# Patient Record
Sex: Male | Born: 1985 | Hispanic: No | Marital: Married | Smoking: Former smoker
Health system: Southern US, Community
[De-identification: ages and names within clinical notes are randomized; demographics above are authoritative.]

## PROBLEM LIST (undated history)

## (undated) DIAGNOSIS — M5430 Sciatica, unspecified side: Secondary | ICD-10-CM

## (undated) DIAGNOSIS — Z9889 Other specified postprocedural states: Secondary | ICD-10-CM

## (undated) DIAGNOSIS — K5780 Diverticulitis of intestine, part unspecified, with perforation and abscess without bleeding: Secondary | ICD-10-CM

## (undated) HISTORY — PX: COLECTOMY WITH COLOSTOMY CREATION/HARTMANN PROCEDURE: SHX6598

## (undated) HISTORY — PX: COLONOSCOPY: SHX174

---

## 2018-04-02 ENCOUNTER — Other Ambulatory Visit: Payer: Self-pay | Admitting: Gastroenterology

## 2018-04-02 DIAGNOSIS — R1032 Left lower quadrant pain: Secondary | ICD-10-CM

## 2018-04-02 DIAGNOSIS — K219 Gastro-esophageal reflux disease without esophagitis: Secondary | ICD-10-CM

## 2020-11-04 ENCOUNTER — Encounter
Admission: EM | Disposition: A | Discharge status: Home / Self Care | Payer: Self-pay | Source: Ambulatory Visit | Attending: General Surgery

## 2020-11-04 ENCOUNTER — Inpatient Hospital Stay: Payer: BC Managed Care – PPO | Admitting: Certified Registered"

## 2020-11-04 ENCOUNTER — Inpatient Hospital Stay
Admission: EM | Admit: 2020-11-04 | Discharge: 2020-11-13 | DRG: 329 | Disposition: A | Discharge status: Home / Self Care | Payer: BC Managed Care – PPO | Source: Ambulatory Visit | Attending: General Surgery | Admitting: General Surgery

## 2020-11-04 ENCOUNTER — Other Ambulatory Visit: Payer: Self-pay

## 2020-11-04 ENCOUNTER — Emergency Department: Payer: BC Managed Care – PPO

## 2020-11-04 DIAGNOSIS — K651 Peritoneal abscess: Secondary | ICD-10-CM | POA: Diagnosis present

## 2020-11-04 DIAGNOSIS — R188 Other ascites: Secondary | ICD-10-CM | POA: Diagnosis not present

## 2020-11-04 DIAGNOSIS — Z20822 Contact with and (suspected) exposure to covid-19: Secondary | ICD-10-CM | POA: Diagnosis present

## 2020-11-04 DIAGNOSIS — R1084 Generalized abdominal pain: Secondary | ICD-10-CM

## 2020-11-04 DIAGNOSIS — K5720 Diverticulitis of large intestine with perforation and abscess without bleeding: Principal | ICD-10-CM | POA: Diagnosis present

## 2020-11-04 DIAGNOSIS — K567 Ileus, unspecified: Secondary | ICD-10-CM | POA: Diagnosis not present

## 2020-11-04 LAB — RESP PANEL BY RT-PCR (FLU A&B, COVID) ARPGX2
Influenza A by PCR: NEGATIVE
Influenza B by PCR: NEGATIVE
SARS Coronavirus 2 by RT PCR: NEGATIVE

## 2020-11-04 LAB — COMPREHENSIVE METABOLIC PANEL
ALT: 33 U/L (ref 0–44)
AST: 24 U/L (ref 15–41)
Albumin: 3.9 g/dL (ref 3.5–5.0)
Alkaline Phosphatase: 57 U/L (ref 38–126)
Anion gap: 12 (ref 5–15)
BUN: 13 mg/dL (ref 6–20)
CO2: 25 mmol/L (ref 22–32)
Calcium: 9.2 mg/dL (ref 8.9–10.3)
Chloride: 99 mmol/L (ref 98–111)
Creatinine, Ser: 0.91 mg/dL (ref 0.61–1.24)
GFR, Estimated: >60 mL/min (ref >60)
Glucose, Bld: 118 mg/dL — ABNORMAL HIGH (ref 70–99)
Potassium: 3.9 mmol/L (ref 3.5–5.1)
Sodium: 136 mmol/L (ref 135–145)
Total Bilirubin: 1.4 mg/dL — ABNORMAL HIGH (ref 0.3–1.2)
Total Protein: 8.6 g/dL — ABNORMAL HIGH (ref 6.5–8.1)

## 2020-11-04 LAB — CBC
HCT: 42.6 % (ref 39.0–52.0)
Hemoglobin: 14.6 g/dL (ref 13.0–17.0)
MCH: 31.1 pg (ref 26.0–34.0)
MCHC: 34.3 g/dL (ref 30.0–36.0)
MCV: 90.8 fL (ref 80.0–100.0)
Platelets: 198 10*3/uL (ref 150–400)
RBC: 4.69 MIL/uL (ref 4.22–5.81)
RDW: 11.8 % (ref 11.5–15.5)
WBC: 22.1 10*3/uL — ABNORMAL HIGH (ref 4.0–10.5)
nRBC: 0 % (ref 0.0–0.2)

## 2020-11-04 LAB — LIPASE, BLOOD: Lipase: 22 U/L (ref 11–51)

## 2020-11-04 SURGERY — COLECTOMY, SIGMOID, ROBOT-ASSISTED
Anesthesia: General | Site: Abdomen

## 2020-11-04 MED ORDER — SUCCINYLCHOLINE CHLORIDE 20 MG/ML IJ SOLN
INTRAMUSCULAR | Status: DC | PRN
  Administered 2020-11-04: 140 mg via INTRAVENOUS

## 2020-11-04 MED ORDER — MUPIROCIN 2 % EX OINT
1.0000 "application " | TOPICAL_OINTMENT | Freq: Two times a day (BID) | CUTANEOUS | Status: AC
  Administered 2020-11-06 – 2020-11-09 (×9): 1 via NASAL
  Filled 2020-11-04 (×2): qty 22

## 2020-11-04 MED ORDER — MORPHINE SULFATE (PF) 4 MG/ML IV SOLN
4.0000 mg | Freq: Once | INTRAVENOUS | Status: AC
  Administered 2020-11-04: 4 mg via INTRAVENOUS
  Filled 2020-11-04: qty 1

## 2020-11-04 MED ORDER — GLYCOPYRROLATE 0.2 MG/ML IJ SOLN
INTRAMUSCULAR | Status: DC | PRN
  Administered 2020-11-04: .2 mg via INTRAVENOUS

## 2020-11-04 MED ORDER — LIDOCAINE HCL (PF) 2 % IJ SOLN
INTRAMUSCULAR | Status: AC
  Filled 2020-11-04: qty 5

## 2020-11-04 MED ORDER — PIPERACILLIN-TAZOBACTAM 3.375 G IVPB 30 MIN
3.3750 g | Freq: Once | INTRAVENOUS | Status: AC
  Administered 2020-11-04: 3.375 g via INTRAVENOUS
  Filled 2020-11-04: qty 50

## 2020-11-04 MED ORDER — ONDANSETRON HCL 4 MG/2ML IJ SOLN
INTRAMUSCULAR | Status: DC | PRN
  Administered 2020-11-04: 4 mg via INTRAVENOUS

## 2020-11-04 MED ORDER — ROCURONIUM BROMIDE 10 MG/ML (PF) SYRINGE
PREFILLED_SYRINGE | INTRAVENOUS | Status: AC
  Filled 2020-11-04: qty 10

## 2020-11-04 MED ORDER — DEXAMETHASONE SODIUM PHOSPHATE 10 MG/ML IJ SOLN
INTRAMUSCULAR | Status: DC | PRN
  Administered 2020-11-04: 10 mg via INTRAVENOUS

## 2020-11-04 MED ORDER — ONDANSETRON 4 MG PO TBDP: 4.0000 mg | ORAL_TABLET | Freq: Four times a day (QID) | ORAL | Status: DC | PRN

## 2020-11-04 MED ORDER — IOHEXOL 300 MG/ML  SOLN
125.0000 mL | Freq: Once | INTRAMUSCULAR | Status: AC | PRN
  Administered 2020-11-04: 125 mL via INTRAVENOUS

## 2020-11-04 MED ORDER — MORPHINE SULFATE (PF) 4 MG/ML IV SOLN
4.0000 mg | INTRAVENOUS | Status: DC | PRN
  Administered 2020-11-05 – 2020-11-13 (×31): 4 mg via INTRAVENOUS
  Filled 2020-11-04 (×34): qty 1

## 2020-11-04 MED ORDER — BUPIVACAINE LIPOSOME 1.3 % IJ SUSP
INTRAMUSCULAR | Status: AC
  Filled 2020-11-04: qty 20

## 2020-11-04 MED ORDER — PIPERACILLIN-TAZOBACTAM 3.375 G IVPB
3.3750 g | Freq: Three times a day (TID) | INTRAVENOUS | Status: DC
  Administered 2020-11-05 – 2020-11-13 (×25): 3.375 g via INTRAVENOUS
  Filled 2020-11-04 (×25): qty 50

## 2020-11-04 MED ORDER — ROCURONIUM BROMIDE 100 MG/10ML IV SOLN
INTRAVENOUS | Status: DC | PRN
  Administered 2020-11-04 (×3): 20 mg via INTRAVENOUS
  Administered 2020-11-04: 50 mg via INTRAVENOUS
  Administered 2020-11-04 – 2020-11-05 (×3): 30 mg via INTRAVENOUS

## 2020-11-04 MED ORDER — ONDANSETRON HCL 4 MG/2ML IJ SOLN
INTRAMUSCULAR | Status: AC
  Filled 2020-11-04: qty 2

## 2020-11-04 MED ORDER — LACTATED RINGERS IV SOLN: INTRAVENOUS | Status: DC | PRN

## 2020-11-04 MED ORDER — ENOXAPARIN SODIUM 40 MG/0.4ML ~~LOC~~ SOLN: 40.0000 mg | SUBCUTANEOUS | Status: DC

## 2020-11-04 MED ORDER — FENTANYL CITRATE (PF) 100 MCG/2ML IJ SOLN
INTRAMUSCULAR | Status: DC | PRN
  Administered 2020-11-04: 50 ug via INTRAVENOUS
  Administered 2020-11-04: 100 ug via INTRAVENOUS
  Administered 2020-11-04 (×2): 50 ug via INTRAVENOUS

## 2020-11-04 MED ORDER — ONDANSETRON HCL 4 MG/2ML IJ SOLN
4.0000 mg | Freq: Four times a day (QID) | INTRAMUSCULAR | Status: DC | PRN
  Administered 2020-11-06 – 2020-11-07 (×2): 4 mg via INTRAVENOUS
  Filled 2020-11-04 (×2): qty 2

## 2020-11-04 MED ORDER — PHENYLEPHRINE HCL-NACL 10-0.9 MG/250ML-% IV SOLN
INTRAVENOUS | Status: DC | PRN
  Administered 2020-11-04: 20 ug/min via INTRAVENOUS

## 2020-11-04 MED ORDER — LIDOCAINE HCL (CARDIAC) PF 100 MG/5ML IV SOSY
PREFILLED_SYRINGE | INTRAVENOUS | Status: DC | PRN
  Administered 2020-11-04: 50 mg via INTRAVENOUS

## 2020-11-04 MED ORDER — DEXMEDETOMIDINE (PRECEDEX) IN NS 20 MCG/5ML (4 MCG/ML) IV SYRINGE
PREFILLED_SYRINGE | INTRAVENOUS | Status: AC
  Filled 2020-11-04: qty 5

## 2020-11-04 MED ORDER — DEXAMETHASONE SODIUM PHOSPHATE 10 MG/ML IJ SOLN
INTRAMUSCULAR | Status: AC
  Filled 2020-11-04: qty 1

## 2020-11-04 MED ORDER — ENOXAPARIN SODIUM 60 MG/0.6ML ~~LOC~~ SOLN
0.5000 mg/kg | SUBCUTANEOUS | Status: DC
  Administered 2020-11-05 – 2020-11-12 (×7): 60 mg via SUBCUTANEOUS
  Filled 2020-11-04 (×9): qty 0.6

## 2020-11-04 MED ORDER — FENTANYL CITRATE (PF) 250 MCG/5ML IJ SOLN
INTRAMUSCULAR | Status: AC
  Filled 2020-11-04: qty 5

## 2020-11-04 MED ORDER — EPHEDRINE SULFATE 50 MG/ML IJ SOLN
INTRAMUSCULAR | Status: DC | PRN
  Administered 2020-11-04: 10 mg via INTRAVENOUS
  Administered 2020-11-05: 20 mg via INTRAVENOUS
  Administered 2020-11-05: 10 mg via INTRAVENOUS

## 2020-11-04 MED ORDER — DEXMEDETOMIDINE (PRECEDEX) IN NS 20 MCG/5ML (4 MCG/ML) IV SYRINGE
PREFILLED_SYRINGE | INTRAVENOUS | Status: DC | PRN
  Administered 2020-11-04: 8 ug via INTRAVENOUS
  Administered 2020-11-04: 12 ug via INTRAVENOUS

## 2020-11-04 MED ORDER — MORPHINE SULFATE (PF) 4 MG/ML IV SOLN
4.0000 mg | Freq: Once | INTRAVENOUS | Status: AC
Start: 2020-11-04 — End: 2020-11-04
  Administered 2020-11-04: 4 mg via INTRAVENOUS
  Filled 2020-11-04: qty 1

## 2020-11-04 MED ORDER — PHENYLEPHRINE HCL (PRESSORS) 10 MG/ML IV SOLN
INTRAVENOUS | Status: DC | PRN
  Administered 2020-11-04 (×3): 100 ug via INTRAVENOUS
  Administered 2020-11-04: 200 ug via INTRAVENOUS
  Administered 2020-11-04: 100 ug via INTRAVENOUS

## 2020-11-04 MED ORDER — ACETAMINOPHEN 10 MG/ML IV SOLN
INTRAVENOUS | Status: AC
  Filled 2020-11-04: qty 100

## 2020-11-04 MED ORDER — SODIUM CHLORIDE 0.9 % IV BOLUS
1000.0000 mL | Freq: Once | INTRAVENOUS | Status: AC
  Administered 2020-11-04: 1000 mL via INTRAVENOUS

## 2020-11-04 MED ORDER — HYDROCODONE-ACETAMINOPHEN 5-325 MG PO TABS
1.0000 | ORAL_TABLET | ORAL | Status: DC | PRN
  Administered 2020-11-05 – 2020-11-06 (×7): 2 via ORAL
  Administered 2020-11-07: 1 via ORAL
  Administered 2020-11-07: 2 via ORAL
  Filled 2020-11-04 (×9): qty 2

## 2020-11-04 MED ORDER — ACETAMINOPHEN 650 MG RE SUPP: 650.0000 mg | Freq: Four times a day (QID) | RECTAL | Status: DC | PRN

## 2020-11-04 MED ORDER — PANTOPRAZOLE SODIUM 40 MG IV SOLR
40.0000 mg | Freq: Every day | INTRAVENOUS | Status: DC
  Administered 2020-11-05 – 2020-11-12 (×8): 40 mg via INTRAVENOUS
  Filled 2020-11-04 (×8): qty 40

## 2020-11-04 MED ORDER — PROPOFOL 10 MG/ML IV BOLUS
INTRAVENOUS | Status: DC | PRN
  Administered 2020-11-04: 200 mg via INTRAVENOUS

## 2020-11-04 MED ORDER — ONDANSETRON HCL 4 MG/2ML IJ SOLN
4.0000 mg | Freq: Once | INTRAMUSCULAR | Status: AC
  Administered 2020-11-04: 4 mg via INTRAVENOUS
  Filled 2020-11-04: qty 2

## 2020-11-04 MED ORDER — PROPOFOL 10 MG/ML IV BOLUS
INTRAVENOUS | Status: AC
  Filled 2020-11-04: qty 40

## 2020-11-04 MED ORDER — BUPIVACAINE HCL (PF) 0.5 % IJ SOLN
INTRAMUSCULAR | Status: AC
  Filled 2020-11-04: qty 30

## 2020-11-04 MED ORDER — ACETAMINOPHEN 325 MG PO TABS
650.0000 mg | ORAL_TABLET | Freq: Four times a day (QID) | ORAL | Status: DC | PRN
  Administered 2020-11-12 – 2020-11-13 (×5): 650 mg via ORAL
  Filled 2020-11-04 (×5): qty 2

## 2020-11-04 MED ORDER — SUCCINYLCHOLINE CHLORIDE 200 MG/10ML IV SOSY
PREFILLED_SYRINGE | INTRAVENOUS | Status: AC
  Filled 2020-11-04: qty 10

## 2020-11-04 SURGICAL SUPPLY — 102 items
BLADE CLIPPER SURG (BLADE) ×2 IMPLANT
BLADE SURG SZ10 CARB STEEL (BLADE) ×2 IMPLANT
BLADE SURG SZ11 CARB STEEL (BLADE) ×2 IMPLANT
BULB RESERV EVAC DRAIN JP 100C (MISCELLANEOUS) ×2 IMPLANT
CANNULA REDUC XI 12-8 STAPL (CANNULA) ×1
CANNULA REDUCER 12-8 DVNC XI (CANNULA) ×1 IMPLANT
CHLORAPREP W/TINT 26 (MISCELLANEOUS) ×2 IMPLANT
CLIP VESOLOCK MED LG 6/CT (CLIP) IMPLANT
COVER TIP SHEARS 8 DVNC (MISCELLANEOUS) ×1 IMPLANT
COVER TIP SHEARS 8MM DA VINCI (MISCELLANEOUS) ×1
COVER WAND RF STERILE (DRAPES) IMPLANT
DEFOGGER SCOPE WARMER CLEARIFY (MISCELLANEOUS) ×2 IMPLANT
DERMABOND ADVANCED (GAUZE/BANDAGES/DRESSINGS)
DERMABOND ADVANCED .7 DNX12 (GAUZE/BANDAGES/DRESSINGS) IMPLANT
DRAIN CHANNEL JP 19F (MISCELLANEOUS) ×2 IMPLANT
DRAPE 3/4 80X56 (DRAPES) ×4 IMPLANT
DRAPE ARM DVNC X/XI (DISPOSABLE) ×4 IMPLANT
DRAPE COLUMN DVNC XI (DISPOSABLE) ×1 IMPLANT
DRAPE DA VINCI XI ARM (DISPOSABLE) ×4
DRAPE DA VINCI XI COLUMN (DISPOSABLE) ×1
DRSG OPSITE POSTOP 4X10 (GAUZE/BANDAGES/DRESSINGS) IMPLANT
DRSG OPSITE POSTOP 4X6 (GAUZE/BANDAGES/DRESSINGS) ×2 IMPLANT
DRSG OPSITE POSTOP 4X8 (GAUZE/BANDAGES/DRESSINGS) IMPLANT
DRSG TEGADERM 2-3/8X2-3/4 SM (GAUZE/BANDAGES/DRESSINGS) ×2 IMPLANT
ELECT BLADE 6.5 EXT (BLADE) IMPLANT
ELECT CAUTERY BLADE 6.4 (BLADE) ×2 IMPLANT
ELECT REM PT RETURN 9FT ADLT (ELECTROSURGICAL) ×2
ELECTRODE REM PT RTRN 9FT ADLT (ELECTROSURGICAL) ×1 IMPLANT
GLOVE SURG ENC MOIS LTX SZ6.5 (GLOVE) ×6 IMPLANT
GLOVE SURG UNDER POLY LF SZ6.5 (GLOVE) ×6 IMPLANT
GOWN STRL REUS W/ TWL LRG LVL3 (GOWN DISPOSABLE) ×6 IMPLANT
GOWN STRL REUS W/TWL LRG LVL3 (GOWN DISPOSABLE) ×6
GRASPER SUT TROCAR 14GX15 (MISCELLANEOUS) IMPLANT
HANDLE YANKAUER SUCT BULB TIP (MISCELLANEOUS) ×2 IMPLANT
IRRIGATION STRYKERFLOW (MISCELLANEOUS) ×1 IMPLANT
IRRIGATOR STRYKERFLOW (MISCELLANEOUS) ×2
IRRIGATOR SUCT 8 DISP DVNC XI (IRRIGATION / IRRIGATOR) ×1 IMPLANT
IRRIGATOR SUCTION 8MM XI DISP (IRRIGATION / IRRIGATOR) ×1
IV NS 1000ML (IV SOLUTION) ×1
IV NS 1000ML BAXH (IV SOLUTION) ×1 IMPLANT
KIT OSTOMY 2 PC DRNBL 2.25 STR (WOUND CARE) ×1 IMPLANT
KIT OSTOMY DRAINABLE 2.25 STR (WOUND CARE) ×1
KIT PINK PAD W/HEAD ARE REST (MISCELLANEOUS) ×2
KIT PINK PAD W/HEAD ARM REST (MISCELLANEOUS) ×1 IMPLANT
LABEL OR SOLS (LABEL) ×2 IMPLANT
MANIFOLD NEPTUNE II (INSTRUMENTS) ×2 IMPLANT
NEEDLE HYPO 22GX1.5 SAFETY (NEEDLE) ×2 IMPLANT
OBTURATOR OPTICAL STANDARD 8MM (TROCAR) ×1
OBTURATOR OPTICAL STND 8 DVNC (TROCAR) ×1
OBTURATOR OPTICALSTD 8 DVNC (TROCAR) ×1 IMPLANT
PACK COLON CLEAN CLOSURE (MISCELLANEOUS) IMPLANT
PACK LAP CHOLECYSTECTOMY (MISCELLANEOUS) ×2 IMPLANT
PENCIL ELECTRO HAND CTR (MISCELLANEOUS) ×2 IMPLANT
PORT ACCESS TROCAR AIRSEAL 5 (TROCAR) ×2 IMPLANT
RELOAD STAPLER 3.5X45 BLU DVNC (STAPLE) ×1 IMPLANT
RELOAD STAPLER 3.5X60 BLU DVNC (STAPLE) ×2 IMPLANT
RELOAD STAPLER 4.3X60 GRN DVNC (STAPLE) ×4 IMPLANT
RETRACTOR RING XSMALL (MISCELLANEOUS) IMPLANT
RTRCTR WOUND ALEXIS 13CM XS SH (MISCELLANEOUS)
SEAL CANN UNIV 5-8 DVNC XI (MISCELLANEOUS) ×3 IMPLANT
SEAL XI 5MM-8MM UNIVERSAL (MISCELLANEOUS) ×3
SEALER VESSEL DA VINCI XI (MISCELLANEOUS) ×2
SEALER VESSEL EXT DVNC XI (MISCELLANEOUS) ×2 IMPLANT
SET TRI-LUMEN FLTR TB AIRSEAL (TUBING) ×2 IMPLANT
SOLUTION ELECTROLUBE (MISCELLANEOUS) ×2 IMPLANT
SPONGE GAUZE 2X2 8PLY STRL LF (GAUZE/BANDAGES/DRESSINGS) ×4 IMPLANT
SPONGE LAP 4X18 RFD (DISPOSABLE) ×2 IMPLANT
STAPLER 45 DA VINCI SURE FORM (STAPLE)
STAPLER 45 SUREFORM DVNC (STAPLE) IMPLANT
STAPLER 60 DA VINCI SURE FORM (STAPLE) ×1
STAPLER 60 SUREFORM DVNC (STAPLE) ×1 IMPLANT
STAPLER CANNULA SEAL DVNC XI (STAPLE) ×1 IMPLANT
STAPLER CANNULA SEAL XI (STAPLE) ×1
STAPLER RELOAD 3.5X45 BLU DVNC (STAPLE) ×1
STAPLER RELOAD 3.5X45 BLUE (STAPLE) ×1
STAPLER RELOAD 3.5X60 BLU DVNC (STAPLE) ×2
STAPLER RELOAD 3.5X60 BLUE (STAPLE) ×2
STAPLER RELOAD 4.3X60 GREEN (STAPLE) ×4
STAPLER RELOAD 4.3X60 GRN DVNC (STAPLE) ×4
STAPLER SKIN PROX 35W (STAPLE) ×2 IMPLANT
SUT ETHILON 3-0 FS-10 30 BLK (SUTURE) ×2
SUT MNCRL 4-0 (SUTURE) ×2
SUT MNCRL 4-0 27XMFL (SUTURE) ×2
SUT PDS PLUS 0 (SUTURE) ×2
SUT PDS PLUS AB 0 CT-2 (SUTURE) ×2 IMPLANT
SUT SILK 0 SH 30 (SUTURE) ×4 IMPLANT
SUT SILK 3-0 (SUTURE) IMPLANT
SUT STRATAFIX PDS 30 CT-1 (SUTURE) ×2 IMPLANT
SUT VIC AB 2-0 SH 27 (SUTURE) ×1
SUT VIC AB 2-0 SH 27XBRD (SUTURE) ×1 IMPLANT
SUT VIC AB 3-0 SH 27 (SUTURE) ×4
SUT VIC AB 3-0 SH 27X BRD (SUTURE) ×4 IMPLANT
SUT VIC AB 3-0 SH 8-18 (SUTURE) ×4 IMPLANT
SUT VICRYL 0 AB UR-6 (SUTURE) ×6 IMPLANT
SUT VLOC 90 6 CV-15 VIOLET (SUTURE) IMPLANT
SUT VLOC 90 S/L VL9 GS22 (SUTURE) ×2 IMPLANT
SUTURE EHLN 3-0 FS-10 30 BLK (SUTURE) ×1 IMPLANT
SUTURE MNCRL 4-0 27XMF (SUTURE) ×2 IMPLANT
SYR 30ML LL (SYRINGE) ×4 IMPLANT
SYS TROCAR 1.5-3 SLV ABD GEL (ENDOMECHANICALS)
SYSTEM TROCR 1.5-3 SLV ABD GEL (ENDOMECHANICALS) IMPLANT
TRAY FOLEY MTR SLVR 16FR STAT (SET/KITS/TRAYS/PACK) ×2 IMPLANT

## 2020-11-04 NOTE — ED Triage Notes (Signed)
Pt comes with c/o upper abdominal pain for 3 days. Pt states he was advised  By his PCP to come here to be seen

## 2020-11-04 NOTE — ED Notes (Signed)
Pt c/o of abd pain returning. Vicente Males, MD notified. Morphine 4mg  ordered.

## 2020-11-04 NOTE — Anesthesia Procedure Notes (Signed)
Procedure Name: Intubation Performed by: Mckenze Slone, CRNA Pre-anesthesia Checklist: Patient identified, Patient being monitored, Timeout performed, Emergency Drugs available and Suction available Patient Re-evaluated:Patient Re-evaluated prior to induction Oxygen Delivery Method: Circle system utilized Preoxygenation: Pre-oxygenation with 100% oxygen Induction Type: IV induction and Rapid sequence Laryngoscope Size: McGraph and 4 Grade View: Grade I Tube type: Oral Tube size: 7.5 mm Number of attempts: 1 Airway Equipment and Method: Stylet and Video-laryngoscopy Placement Confirmation: ETT inserted through vocal cords under direct vision,  positive ETCO2 and breath sounds checked- equal and bilateral Secured at: 23 cm Tube secured with: Tape Dental Injury: Teeth and Oropharynx as per pre-operative assessment        

## 2020-11-04 NOTE — ED Notes (Signed)
Pt states that pain is at ease at the moment.

## 2020-11-04 NOTE — H&P (Signed)
SURGICAL HISTORY AND PHYSICAL NOTE   HISTORY OF PRESENT ILLNESS (HPI):  35 y.o. male presented to [facility] ED for evaluation of abdominal pain that started 3 days ago.  The patient reported the pain started to intensified today.  The pain is generalized.  There is no pain radiation.  Aggravating factor is applying pressure in the abdomen.  Alleviating factor is morphine.  He reported nausea but no vomiting.  In the ED he was found with leukocytosis.  CT scan of the abdomen and pelvis shows free air with generalized peritonitis.  I personally evaluated the images.  Surgery is consulted by Dr. Vicente Males in this context for evaluation and management of perforated diverticulitis.  PAST MEDICAL HISTORY (PMH):  History reviewed. No pertinent past medical history.   PAST SURGICAL HISTORY (PSH):  History reviewed. No pertinent surgical history.   MEDICATIONS:  Prior to Admission medications   Not on File     ALLERGIES:  No Known Allergies   SOCIAL HISTORY:  Social History   Socioeconomic History  . Marital status: Unknown    Spouse name: Not on file  . Number of children: Not on file  . Years of education: Not on file  . Highest education level: Not on file  Occupational History  . Not on file  Tobacco Use  . Smoking status: Never Smoker  . Smokeless tobacco: Never Used  Substance and Sexual Activity  . Alcohol use: Yes  . Drug use: Never  . Sexual activity: Not on file  Other Topics Concern  . Not on file  Social History Narrative  . Not on file   Social Determinants of Health   Financial Resource Strain: Not on file  Food Insecurity: Not on file  Transportation Needs: Not on file  Physical Activity: Not on file  Stress: Not on file  Social Connections: Not on file  Intimate Partner Violence: Not on file      FAMILY HISTORY:  No family history on file.   REVIEW OF SYSTEMS:  Constitutional: denies weight loss, fever, chills, or sweats  Eyes: denies any other vision  changes, history of eye injury  ENT: denies sore throat, hearing problems  Respiratory: denies shortness of breath, wheezing  Cardiovascular: denies chest pain, palpitations  Gastrointestinal: Positive abdominal pain, nausea Genitourinary: denies burning with urination or urinary frequency Musculoskeletal: denies any other joint pains or cramps  Skin: denies any other rashes or skin discolorations  Neurological: denies any other headache, dizziness, weakness  Psychiatric: denies any other depression, anxiety   All other review of systems were negative   VITAL SIGNS:  Temp:  [100.4 F (38 C)] 100.4 F (38 C) (02/10 1525) Pulse Rate:  [105-120] 109 (02/10 1830) Resp:  [17-26] 21 (02/10 1830) BP: (109-129)/(75-95) 117/75 (02/10 1830) SpO2:  [91 %-95 %] 93 % (02/10 1830) Weight:  [117.9 kg] 117.9 kg (02/10 1523)     Height: 6\' 4"  (193 cm) Weight: 117.9 kg BMI (Calculated): 31.66   INTAKE/OUTPUT:  This shift: No intake/output data recorded.  Last 2 shifts: @IOLAST2SHIFTS @   PHYSICAL EXAM:  Constitutional:  -- Normal body habitus  -- Awake, alert, and oriented x3  Eyes:  -- Pupils equally round and reactive to light  -- No scleral icterus  Ear, nose, and throat:  -- No jugular venous distension  Pulmonary:  -- No crackles  -- Equal breath sounds bilaterally -- Breathing non-labored at rest Cardiovascular:  -- S1, S2 present  -- No pericardial rubs Gastrointestinal:  -- Abdomen  soft, tender, non-distended, with guarding but no rebound tenderness -- No abdominal masses appreciated, pulsatile or otherwise  Musculoskeletal and Integumentary:  -- Wounds: None appreciated -- Extremities: B/L UE and LE FROM, hands and feet warm, no edema  Neurologic:  -- Motor function: intact and symmetric -- Sensation: intact and symmetric   Labs:  CBC Latest Ref Rng & Units 11/04/2020  WBC 4.0 - 10.5 K/uL 22.1(H)  Hemoglobin 13.0 - 17.0 g/dL 70.2  Hematocrit 63.7 - 52.0 % 42.6   Platelets 150 - 400 K/uL 198   CMP Latest Ref Rng & Units 11/04/2020  Glucose 70 - 99 mg/dL 858(I)  BUN 6 - 20 mg/dL 13  Creatinine 5.02 - 7.74 mg/dL 1.28  Sodium 786 - 767 mmol/L 136  Potassium 3.5 - 5.1 mmol/L 3.9  Chloride 98 - 111 mmol/L 99  CO2 22 - 32 mmol/L 25  Calcium 8.9 - 10.3 mg/dL 9.2  Total Protein 6.5 - 8.1 g/dL 2.0(N)  Total Bilirubin 0.3 - 1.2 mg/dL 4.7(S)  Alkaline Phos 38 - 126 U/L 57  AST 15 - 41 U/L 24  ALT 0 - 44 U/L 33    Imaging studies:  EXAM: CT ABDOMEN AND PELVIS WITH CONTRAST  TECHNIQUE: Multidetector CT imaging of the abdomen and pelvis was performed using the standard protocol following bolus administration of intravenous contrast.  CONTRAST:  OMNIPAQUE IOHEXOL 300 MG/ML  SOLN  COMPARISON:  None.  FINDINGS: Lower chest: The visualized lung bases are clear.  Scattered pneumoperitoneum and trace free fluid in the pelvis.  Hepatobiliary: Mild fatty liver. No intrahepatic biliary dilatation. The gallbladder is unremarkable.  Pancreas: Unremarkable. No pancreatic ductal dilatation or surrounding inflammatory changes.  Spleen: Normal in size without focal abnormality.  Adrenals/Urinary Tract: The adrenal glands unremarkable the kidneys, visualized ureters, and urinary bladder appear unremarkable.  Stomach/Bowel: There is sigmoid diverticulosis without active inflammatory changes. There is extraluminal air consistent with perforation. No drainable fluid collection or abscess. Inflammatory changes of distal small bowel, reactive to sigmoid diverticulitis. There is no bowel obstruction. The appendix is normal.  Vascular/Lymphatic: The abdominal aorta IVC unremarkable. No portal venous gas. There is no adenopathy.  Reproductive: The prostate and seminal vesicles are grossly unremarkable. No pelvic mass.  Other: None  Musculoskeletal: No acute or significant osseous findings.  IMPRESSION: 1. Perforated sigmoid  diverticulitis. No drainable fluid collection or abscess. 2. Reactive changes of distal small bowel. No bowel obstruction. Normal appendix. 3. Mild fatty liver.  These results were called by telephone at the time of interpretation on 11/04/2020 at 5:10 pm to provider [facility] , who verbally acknowledged these results.   Electronically Signed   By: Elgie Collard M.D.   On: 11/04/2020 17:12  Assessment/Plan:  35 y.o. male with perforated diverticulitis with generalized peritonitis.  Patient was oriented about the CT scan finding.  The physical exam and history is consistent with perforated diverticulitis.  I discussed with the patient that the best treatment for diverticulitis is resection of the diseased portion of the intestine with creation of end colostomy.  The patient is very concerned about his future regarding his job and he requested to try to proceed with minimally invasive procedure if possible.  I discussed with the patient the alternative of robotic assisted laparoscopic partial colectomy with end colostomy but that there is always a chance that the surgery cannot be completed laparoscopically and will need to be converted to open surgical management.  He report understood and agreed to proceed with surgical  management.  Patient started on antibiotic therapy.  Patient n.p.o. and with IV fluid hydration   Gae Gallop, MD

## 2020-11-04 NOTE — Anesthesia Preprocedure Evaluation (Addendum)
Anesthesia Evaluation  Patient identified by MRN, date of birth, ID band Patient awake    Reviewed: Allergy & Precautions, H&P , NPO status , Patient's Chart, lab work & pertinent test results  History of Anesthesia Complications Negative for: history of anesthetic complications  Airway Mallampati: I  TM Distance: >3 FB Neck ROM: full    Dental  (+) Chipped   Pulmonary neg shortness of breath, sleep apnea ,    Pulmonary exam normal        Cardiovascular (-) angina(-) Past MI negative cardio ROS Normal cardiovascular exam     Neuro/Psych negative neurological ROS  negative psych ROS   GI/Hepatic negative GI ROS, Neg liver ROS,   Endo/Other  negative endocrine ROS  Renal/GU      Musculoskeletal   Abdominal   Peds  Hematology negative hematology ROS (+)   Anesthesia Other Findings Septic w free air  History reviewed. No pertinent surgical history.  BMI    Body Mass Index: 31.65 kg/m      Reproductive/Obstetrics negative OB ROS                            Anesthesia Physical Anesthesia Plan  ASA: III and emergent  Anesthesia Plan: General ETT, Rapid Sequence and Cricoid Pressure   Post-op Pain Management:    Induction: Intravenous  PONV Risk Score and Plan: Ondansetron, Dexamethasone, Midazolam and Treatment may vary due to age or medical condition  Airway Management Planned: Oral ETT  Additional Equipment:   Intra-op Plan:   Post-operative Plan: Extubation in OR  Informed Consent: I have reviewed the patients History and Physical, chart, labs and discussed the procedure including the risks, benefits and alternatives for the proposed anesthesia with the patient or authorized representative who has indicated his/her understanding and acceptance.     Dental Advisory Given  Plan Discussed with: Anesthesiologist, CRNA and Surgeon  Anesthesia Plan Comments: (Patient  consented for risks of anesthesia including but not limited to:  - adverse reactions to medications - damage to eyes, teeth, lips or other oral mucosa - nerve damage due to positioning  - sore throat or hoarseness - Damage to heart, brain, nerves, lungs, other parts of body or loss of life  Patient voiced understanding.)        Anesthesia Quick Evaluation

## 2020-11-04 NOTE — ED Provider Notes (Signed)
[facility] Emergency Department Provider Note   ____________________________________________   Event Date/Time   First MD Initiated Contact with Patient 11/04/20 1603     (approximate)  I have reviewed the triage vital signs and the nursing notes.   HISTORY  Chief Complaint Abdominal Pain    HPI Mark Spencer is a 35 y.o. male with no stated past medical history who presents for abdominal pain over the last 3 days.  Patient describes suprapubic abdominal pressure that now radiates into all abdominal quadrants.  Patient describes initially with pain at approximately 3/10 that has worsened to 10/10 on occasion and waxes and wanes.  Patient denies any exacerbating or relieving factors.  Patient does endorse associated nonbloody diarrhea.  Patient currently denies any vision changes, tinnitus, difficulty speaking, facial droop, sore throat, chest pain, shortness of breath, nausea/vomiting, dysuria, or weakness/numbness/paresthesias in any extremity         History reviewed. No pertinent past medical history.  Patient Active Problem List   Diagnosis Date Noted  . Diverticulitis of colon with perforation 11/04/2020    History reviewed. No pertinent surgical history.  Prior to Admission medications   Not on File    Allergies Patient has no known allergies.  History reviewed. No pertinent family history.  Social History Social History   Tobacco Use  . Smoking status: Never Smoker  . Smokeless tobacco: Never Used  Substance Use Topics  . Alcohol use: Yes  . Drug use: Never    Review of Systems Constitutional: No fever/chills Eyes: No visual changes. ENT: No sore throat. Cardiovascular: Denies chest pain. Respiratory: Denies shortness of breath. Gastrointestinal: Endorses abdominal pain nausea, and diarrhea.  Patient denies vomiting Genitourinary: Negative for dysuria. Musculoskeletal: Negative for acute arthralgias Skin: Negative for  rash. Neurological: Negative for headaches, weakness/numbness/paresthesias in any extremity Psychiatric: Negative for suicidal ideation/homicidal ideation   ____________________________________________   PHYSICAL EXAM:  VITAL SIGNS: ED Triage Vitals  Enc Vitals Group     BP 11/04/20 1525 (!) 129/95     Pulse Rate 11/04/20 1525 (!) 120     Resp 11/04/20 1525 20     Temp 11/04/20 1525 (!) 100.4 F (38 C)     Temp Source 11/04/20 1525 Oral     SpO2 11/04/20 1525 94 %     Weight 11/04/20 1523 260 lb (117.9 kg)     Height 11/04/20 1523 6\' 4"  (1.93 m)     Head Circumference --      Peak Flow --      Pain Score 11/04/20 1518 7     Pain Loc --      Pain Edu? --      Excl. in GC? --    Constitutional: Alert and oriented. Well appearing and in no acute distress. Eyes: Conjunctivae are normal. PERRL. Head: Atraumatic. Nose: No congestion/rhinnorhea. Mouth/Throat: Mucous membranes are moist. Neck: No stridor Cardiovascular: Grossly normal heart sounds.  Good peripheral circulation. Respiratory: Normal respiratory effort.  No retractions. Gastrointestinal: Soft and generalized tenderness palpation in all quadrants. No distention. Musculoskeletal: No obvious deformities Neurologic:  Normal speech and language. No gross focal neurologic deficits are appreciated. Skin:  Skin is warm and dry. No rash noted. Psychiatric: Mood and affect are normal. Speech and behavior are normal.  ____________________________________________   LABS (all labs ordered are listed, but only abnormal results are displayed)  Labs Reviewed  COMPREHENSIVE METABOLIC PANEL - Abnormal; Notable for the following components:      Result Value  Glucose, Bld 118 (*)    Total Protein 8.6 (*)    Total Bilirubin 1.4 (*)    All other components within normal limits  CBC - Abnormal; Notable for the following components:   WBC 22.1 (*)    All other components within normal limits  RESP PANEL BY RT-PCR (FLU A&B,  COVID) ARPGX2  CULTURE, BLOOD (SINGLE)  SURGICAL PCR SCREEN  LIPASE, BLOOD  URINALYSIS, COMPLETE (UACMP) WITH MICROSCOPIC  HIV ANTIBODY (ROUTINE TESTING W REFLEX)  BASIC METABOLIC PANEL  CBC    RADIOLOGY  ED MD interpretation: CT of the abdomen and pelvis with IV contrast shows his perforated sigmoid diverticulitis without drainable fluid collection or abscess  Official radiology report(s): CT Abdomen Pelvis W Contrast  Result Date: 11/04/2020 CLINICAL DATA:  35 year old male with right lower quadrant abdominal pain. EXAM: CT ABDOMEN AND PELVIS WITH CONTRAST TECHNIQUE: Multidetector CT imaging of the abdomen and pelvis was performed using the standard protocol following bolus administration of intravenous contrast. CONTRAST:  OMNIPAQUE IOHEXOL 300 MG/ML  SOLN COMPARISON:  None. FINDINGS: Lower chest: The visualized lung bases are clear. Scattered pneumoperitoneum and trace free fluid in the pelvis. Hepatobiliary: Mild fatty liver. No intrahepatic biliary dilatation. The gallbladder is unremarkable. Pancreas: Unremarkable. No pancreatic ductal dilatation or surrounding inflammatory changes. Spleen: Normal in size without focal abnormality. Adrenals/Urinary Tract: The adrenal glands unremarkable the kidneys, visualized ureters, and urinary bladder appear unremarkable. Stomach/Bowel: There is sigmoid diverticulosis without active inflammatory changes. There is extraluminal air consistent with perforation. No drainable fluid collection or abscess. Inflammatory changes of distal small bowel, reactive to sigmoid diverticulitis. There is no bowel obstruction. The appendix is normal. Vascular/Lymphatic: The abdominal aorta IVC unremarkable. No portal venous gas. There is no adenopathy. Reproductive: The prostate and seminal vesicles are grossly unremarkable. No pelvic mass. Other: None Musculoskeletal: No acute or significant osseous findings. IMPRESSION: 1. Perforated sigmoid diverticulitis. No  drainable fluid collection or abscess. 2. Reactive changes of distal small bowel. No bowel obstruction. Normal appendix. 3. Mild fatty liver. These results were called by telephone at the time of interpretation on 11/04/2020 at 5:10 pm to provider [facility] , who verbally acknowledged these results. Electronically Signed   By: Elgie Collard M.D.   On: 11/04/2020 17:12    ____________________________________________   PROCEDURES  Procedure(s) performed (including Critical Care):  .Critical Care Performed by: Merwyn Katos, MD Authorized by: Merwyn Katos, MD   Critical care provider statement:    Critical care time (minutes):  35   Critical care time was exclusive of:  Separately billable procedures and treating other patients   Critical care was necessary to treat or prevent imminent or life-threatening deterioration of the following conditions:  Sepsis   Critical care was time spent personally by me on the following activities:  Discussions with consultants, evaluation of patient's response to treatment, examination of patient, ordering and performing treatments and interventions, ordering and review of laboratory studies, ordering and review of radiographic studies, pulse oximetry, re-evaluation of patient's condition, obtaining history from patient or surrogate and review of old charts   I assumed direction of critical care for this patient from another provider in my specialty: yes     Care discussed with: admitting provider   .1-3 Lead EKG Interpretation Performed by: Merwyn Katos, MD Authorized by: Merwyn Katos, MD     Interpretation: abnormal     ECG rate:  108   ECG rate assessment: tachycardic     Rhythm: sinus  tachycardia     Ectopy: none     Conduction: normal       ____________________________________________   INITIAL IMPRESSION / ASSESSMENT AND PLAN / ED COURSE  As part of my medical decision making, I reviewed the following data within the  electronic MEDICAL RECORD NUMBER Nursing notes reviewed and incorporated, Labs reviewed, EKG interpreted, Old chart reviewed, Radiograph reviewed and Notes from prior ED visits reviewed and incorporated        Patient is a 35 year old male with evidence of perforated diverticulitis and will require admission to the surgical service as well as on-call to the OR.  Differential diagnosis for this patient includes but is not limited to: Appendicitis, gallbladder disease, AAA, infectious diarrhea  Dispo: Admit to surgery      ____________________________________________   FINAL CLINICAL IMPRESSION(S) / ED DIAGNOSES  Final diagnoses:  Generalized abdominal pain  Diverticulitis of large intestine with perforation without abscess or bleeding     ED Discharge Orders    None       Note:  This document was prepared using Dragon voice recognition software and may include unintentional dictation errors.   Merwyn Katos, MD 11/04/20 2029

## 2020-11-04 NOTE — ED Triage Notes (Signed)
Pt has low abd pain radiating into upper abd.  Sx for 3 days.  Diarrhea x 3.  No vomiting.  Pt alert  Speech clear.

## 2020-11-04 NOTE — Progress Notes (Signed)
PHARMACIST - PHYSICIAN COMMUNICATION  CONCERNING:  Enoxaparin (Lovenox) for DVT Prophylaxis    RECOMMENDATION: Patient was prescribed enoxaprin 40mg  q24 hours for VTE prophylaxis.   Filed Weights   11/04/20 1523  Weight: 117.9 kg (260 lb)    Body mass index is 31.65 kg/m.  Estimated Creatinine Clearance: 157.5 mL/min (by C-G formula based on SCr of 0.91 mg/dL).   Based on [facility] policy patient is candidate for enoxaparin 0.5mg /kg TBW SQ every 24 hours based on BMI being >30.  DESCRIPTION: Pharmacy has adjusted enoxaparin dose per [facility] policy.  Patient is now receiving enoxaparin 60 mg every 24 hours    [facility], PharmD Clinical Pharmacist  11/04/2020 7:09 PM

## 2020-11-05 LAB — CBC
HCT: 39.7 % (ref 39.0–52.0)
Hemoglobin: 13.8 g/dL (ref 13.0–17.0)
MCH: 32.2 pg (ref 26.0–34.0)
MCHC: 34.8 g/dL (ref 30.0–36.0)
MCV: 92.5 fL (ref 80.0–100.0)
Platelets: 185 10*3/uL (ref 150–400)
RBC: 4.29 MIL/uL (ref 4.22–5.81)
RDW: 11.7 % (ref 11.5–15.5)
WBC: 22.7 10*3/uL — ABNORMAL HIGH (ref 4.0–10.5)
nRBC: 0 % (ref 0.0–0.2)

## 2020-11-05 LAB — URINALYSIS, COMPLETE (UACMP) WITH MICROSCOPIC
Bacteria, UA: NONE SEEN
Bilirubin Urine: NEGATIVE
Glucose, UA: NEGATIVE mg/dL
Ketones, ur: 20 mg/dL — AB
Leukocytes,Ua: NEGATIVE
Nitrite: NEGATIVE
Protein, ur: 30 mg/dL — AB
Specific Gravity, Urine: >1.046 — ABNORMAL HIGH (ref 1.005–1.030)
Squamous Epithelial / HPF: NONE SEEN (ref 0–5)
WBC, UA: NONE SEEN WBC/hpf (ref 0–5)
pH: 5 (ref 5.0–8.0)

## 2020-11-05 LAB — BASIC METABOLIC PANEL
Anion gap: 11 (ref 5–15)
BUN: 14 mg/dL (ref 6–20)
CO2: 24 mmol/L (ref 22–32)
Calcium: 8.4 mg/dL — ABNORMAL LOW (ref 8.9–10.3)
Chloride: 101 mmol/L (ref 98–111)
Creatinine, Ser: 0.93 mg/dL (ref 0.61–1.24)
GFR, Estimated: >60 mL/min (ref >60)
Glucose, Bld: 171 mg/dL — ABNORMAL HIGH (ref 70–99)
Potassium: 3.9 mmol/L (ref 3.5–5.1)
Sodium: 136 mmol/L (ref 135–145)

## 2020-11-05 LAB — HIV ANTIBODY (ROUTINE TESTING W REFLEX): HIV Screen 4th Generation wRfx: NONREACTIVE

## 2020-11-05 MED ORDER — BUPIVACAINE LIPOSOME 1.3 % IJ SUSP
INTRAMUSCULAR | Status: DC | PRN
  Administered 2020-11-05: 20 mL

## 2020-11-05 MED ORDER — SUGAMMADEX SODIUM 200 MG/2ML IV SOLN
INTRAVENOUS | Status: DC | PRN
  Administered 2020-11-05: 450 mg via INTRAVENOUS

## 2020-11-05 MED ORDER — HYDROMORPHONE HCL 1 MG/ML IJ SOLN
INTRAMUSCULAR | Status: DC | PRN
  Administered 2020-11-05 (×2): .5 mg via INTRAVENOUS

## 2020-11-05 MED ORDER — OXYCODONE HCL 5 MG PO TABS: 5.0000 mg | ORAL_TABLET | Freq: Once | ORAL | Status: DC | PRN

## 2020-11-05 MED ORDER — FENTANYL CITRATE (PF) 100 MCG/2ML IJ SOLN
25.0000 ug | INTRAMUSCULAR | Status: AC | PRN
  Administered 2020-11-05 (×4): 25 ug via INTRAVENOUS

## 2020-11-05 MED ORDER — SUGAMMADEX SODIUM 500 MG/5ML IV SOLN
INTRAVENOUS | Status: AC
  Filled 2020-11-05: qty 5

## 2020-11-05 MED ORDER — FENTANYL CITRATE (PF) 100 MCG/2ML IJ SOLN
INTRAMUSCULAR | Status: AC
  Administered 2020-11-05: 25 ug via INTRAVENOUS
  Filled 2020-11-05: qty 2

## 2020-11-05 MED ORDER — CHLORHEXIDINE GLUCONATE CLOTH 2 % EX PADS: 6.0000 | MEDICATED_PAD | Freq: Every day | CUTANEOUS | Status: DC

## 2020-11-05 MED ORDER — ALUM & MAG HYDROXIDE-SIMETH 200-200-20 MG/5ML PO SUSP
30.0000 mL | Freq: Four times a day (QID) | ORAL | Status: DC | PRN
  Administered 2020-11-05 – 2020-11-12 (×3): 30 mL via ORAL
  Filled 2020-11-05 (×3): qty 30

## 2020-11-05 MED ORDER — SODIUM CHLORIDE 0.9 % IV SOLN
INTRAVENOUS | Status: DC | PRN
  Administered 2020-11-05 – 2020-11-10 (×7): 250 mL via INTRAVENOUS
  Administered 2020-11-12: 1000 mL via INTRAVENOUS

## 2020-11-05 MED ORDER — HYDROMORPHONE HCL 1 MG/ML IJ SOLN
INTRAMUSCULAR | Status: AC
  Filled 2020-11-05: qty 1

## 2020-11-05 MED ORDER — ACETAMINOPHEN 10 MG/ML IV SOLN
INTRAVENOUS | Status: DC | PRN
  Administered 2020-11-05: 1000 mg via INTRAVENOUS

## 2020-11-05 MED ORDER — HYDROMORPHONE HCL 1 MG/ML IJ SOLN: 0.2500 mg | INTRAMUSCULAR | Status: DC | PRN

## 2020-11-05 MED ORDER — BUPIVACAINE HCL (PF) 0.5 % IJ SOLN
INTRAMUSCULAR | Status: DC | PRN
  Administered 2020-11-05: 30 mL

## 2020-11-05 MED ORDER — KETOROLAC TROMETHAMINE 30 MG/ML IJ SOLN
30.0000 mg | Freq: Three times a day (TID) | INTRAMUSCULAR | Status: AC
  Administered 2020-11-05 – 2020-11-07 (×6): 30 mg via INTRAVENOUS
  Filled 2020-11-05 (×6): qty 1

## 2020-11-05 MED ORDER — OXYCODONE HCL 5 MG/5ML PO SOLN: 5.0000 mg | Freq: Once | ORAL | Status: DC | PRN

## 2020-11-05 NOTE — Progress Notes (Signed)
SURGICAL PROGRESS NOTE   Hospital Day(s): 1.   Post op day(s): 1 Day Post-Op.   Interval History: Patient seen and examined, no acute events or new complaints overnight. Patient reports doing well but still having intermittent pain.  Denies nausea or vomiting.  Patient has been able to ambulate.  Vital signs in last 24 hours: [min-max] current  Temp:  [97.5 F (36.4 C)-98.7 F (37.1 C)] 97.8 F (36.6 C) (02/11 1533) Pulse Rate:  [65-110] 79 (02/11 1533) Resp:  [9-23] 18 (02/11 1533) BP: (87-143)/(46-95) 115/73 (02/11 1533) SpO2:  [85 %-97 %] 97 % (02/11 1533)     Height: 6\' 4"  (193 cm) Weight: 117.9 kg BMI (Calculated): 31.66   Physical Exam:  Constitutional: alert, cooperative and no distress  Respiratory: breathing non-labored at rest  Cardiovascular: regular rate and sinus rhythm  Gastrointestinal: soft, non-tender, and non-distended.  Colostomy patent  Labs:  CBC Latest Ref Rng & Units 11/05/2020 11/04/2020  WBC 4.0 - 10.5 K/uL 22.7(H) 22.1(H)  Hemoglobin 13.0 - 17.0 g/dL 01/02/2021 70.9  Hematocrit 62.8 - 52.0 % 39.7 42.6  Platelets 150 - 400 K/uL 185 198   CMP Latest Ref Rng & Units 11/05/2020 11/04/2020  Glucose 70 - 99 mg/dL 01/02/2021) 294(T)  BUN 6 - 20 mg/dL 14 13  Creatinine 654(Y - 1.24 mg/dL 5.03 5.46  Sodium 5.68 - 145 mmol/L 136 136  Potassium 3.5 - 5.1 mmol/L 3.9 3.9  Chloride 98 - 111 mmol/L 101 99  CO2 22 - 32 mmol/L 24 25  Calcium 8.9 - 10.3 mg/dL 127) 9.2  Total Protein 6.5 - 8.1 g/dL - 8.6(H)  Total Bilirubin 0.3 - 1.2 mg/dL - 5.1(Z)  Alkaline Phos 38 - 126 U/L - 57  AST 15 - 41 U/L - 24  ALT 0 - 44 U/L - 33    Imaging studies: No new pertinent imaging studies   Assessment/Plan:  35 y.o. male with perforated radiculitis 1 Day Post-Op s/p robotic assisted laparoscopic sigmoid colectomy with creation of end colostomy.  Patient recovering slowly.  Will optimize pain management.  We will continue with clear liquid diet.  Continue with DVT prophylaxis.   Encourage the patient to ambulate.  We will discontinue Foley.   31, MD

## 2020-11-05 NOTE — Transfer of Care (Signed)
Immediate Anesthesia Transfer of Care Note  Patient: Mark Spencer  Procedure(s) Performed: XI ROBOT ASSISTED SIGMOID COLECTOMY (N/A Abdomen)  Patient Location: PACU  Anesthesia Type:General  Level of Consciousness: awake, alert  and oriented  Airway & Oxygen Therapy: Patient Spontanous Breathing and Patient connected to face mask oxygen  Post-op Assessment: Report given to RN and Post -op Vital signs reviewed and stable  Post vital signs: Reviewed  Last Vitals:  Vitals Value Taken Time  BP    Temp    Pulse    Resp    SpO2      Last Pain:  Vitals:   11/04/20 1808  TempSrc:   PainSc: 3          Complications: No complications documented.

## 2020-11-05 NOTE — Progress Notes (Signed)
   11/05/20 1050  Clinical Encounter Type  Visited With Patient and family together  Visit Type Other (Comment) (AD Education)  Referral From Physician  Consult/Referral To Chaplain   As the on-call chaplain, I visited with Mark Spencer regarding an Scientist, water quality. He asked me to leave it with him so he could read over it and think about it. I explained to him what it offered, and I left it with him.   Chaplain Trudie Buckler, [place]

## 2020-11-05 NOTE — Anesthesia Postprocedure Evaluation (Signed)
Anesthesia Post Note  Patient: Mark Spencer  Procedure(s) Performed: XI ROBOT ASSISTED SIGMOID COLECTOMY (N/A Abdomen)  Patient location during evaluation: PACU Anesthesia Type: General Level of consciousness: awake and alert Pain management: pain level controlled Vital Signs Assessment: post-procedure vital signs reviewed and stable Respiratory status: spontaneous breathing, nonlabored ventilation, respiratory function stable and patient connected to nasal cannula oxygen Cardiovascular status: blood pressure returned to baseline and stable Postop Assessment: no apparent nausea or vomiting Anesthetic complications: no   No complications documented.   Last Vitals:  Vitals:   11/05/20 0419 11/05/20 0545  BP: 129/84 131/75  Pulse: 95 84  Resp: 18 20  Temp: 36.5 C 36.6 C  SpO2: 95% 97%    Last Pain:  Vitals:   11/05/20 0545  TempSrc: Oral  PainSc: 7                  Cleda Mccreedy Verlie Liotta

## 2020-11-05 NOTE — Op Note (Signed)
Preoperative diagnosis: Perforated sigmoid diverticulitis  Procedure: Robotic assisted laparoscopic sigmoid colectomy with end colostomy creation   Anesthesia: GETA   Surgeon: Carolan Shiver, MD  Assistant: None   Wound Classification: Clean contaminated   Specimen: Sigmoid colon   Complications: None   Estimated Blood Loss: 250 mL   Indications: Patient is a 35 y.o. male with abdominal pain since 3 days ago.  CT scan was consistent with perforated diverticulitis.   FIndings: 1.  Large perforation in the mesenteric side of the sigmoid colon 2.  Multiple mesenteric abscesses identified.  There was pus in the pelvis. 3.    Adequate hemostasis   Description of procedure: The patient was placed on the operating table in the supine position, both arms tucked. General anesthesia was induced. A time-out was completed verifying correct patient, procedure, site, positioning, and implant(s) and/or special equipment prior to beginning this procedure. The abdomen was prepped and draped in the usual sterile fashion.    Veress needle was inserted in the supraumbilical area.  Abdominal cavity was insufflated to 15 mmHg.  Patient tolerated insufflation well.  An 8 mm trocar was inserted in the right upper quadrant.  Two 2 other millimeter trocar were placed on the lateral right abdomen.  A 12 mm trocar was inserted in the right lower quadrant.  No injuries from trocar placements were noted. The table was placed in the Trendelenburg position.  Xi robotic platform was then brought to the operative field and docked at an angle from the left lower quadrant.  Examination of the abdominal cavity shows multiple loops of small bowel adhered to the sigmoid colon.  There was a large perforation identified on the mesentery portion of the sigmoid colon.  The sigmoid colon was severely thickened and inflamed.  The distal sigmoid and distal descending colon are viable and healthy.  Upon mobilization of the  small bowel there were multiple intramesenteric abscesses.  There was also pus identified in the pelvic area.   The sigmoid and descending colon was mobilized from the lateral attachment following the [employer].  When mobilized, a small window on the mesentery of the distal descending colon where healthy colon was identified was done.  At this point the descending colon was divided with linear stapler.  Then the mesentery was divided with Vessel Sealer device.  Time-consuming division of the mesentery of the sigmoid needed to be done due to the multiple intra-abdominal and intra mesenteric abscesses.  The mesentery was divided down to the distal sigmoid. At this point the distal sigmoid was divided with stapler device.  The abdominal cavity was thoroughly irrigated and saline was suctioned.  A 19 French drain was left in the pelvis.  The descending colon was seen to be adequately mobilized to the area of the colostomy.  The descending colon was grabbed with a grasper.  At this point the [employer] robot was undocked.  A 5 cm incision was done in the suprapubic area.  Dissection was taken down to the anterior fascia.  The anterior fascia was opened through the midline.  The abdominal cavity was entered.  The sigmoid colon was grabbed with Tanja Port.  The sigmoid colon was removed from the abdominal cavity.  The posterior fascia was closed with 2-0 Vicryl.  The anterior fascia was closed with 0 stratafix.  A small round opening of the skin was done in the left abdomen.  Dissection was taken down to the anterior fascia.  Anterior fascia was opened.  The  rectus abdominis was split.  The posterior fascia was opened.  The descending colon was grabbed with the back of and exteriorized.  The descending colon was open and the colostomy was matured with 3-0 Vicryls.  The skin was closed with staples.  The patient tolerated the procedure well, awakened from anesthesia and was taken to the postanesthesia care  unit in satisfactory condition.  Foley still in place.  Sponge count and instrument count correct at the end of the procedure.

## 2020-11-06 LAB — SURGICAL PCR SCREEN
MRSA, PCR: NEGATIVE
Staphylococcus aureus: POSITIVE — AB

## 2020-11-06 MED ORDER — CHLORHEXIDINE GLUCONATE CLOTH 2 % EX PADS
6.0000 | MEDICATED_PAD | Freq: Every day | CUTANEOUS | Status: DC
  Administered 2020-11-06 – 2020-11-07 (×2): 6 via TOPICAL

## 2020-11-06 NOTE — Progress Notes (Signed)
Dressing to JP drain became saturated with drainage right after Dr Hazle Quant changed it. I changed it again to just see if it was due to manipulation but it has since became saturated again. Dr Hazle Quant updated on this

## 2020-11-06 NOTE — Progress Notes (Signed)
Dr Hazle Quant informed that this patient is concerned about his colostomy. he said he was having bubbling/bowel sounds earlier today but they stopped this evening. He also says he feels like pressure is building up near the colostomy. The ostomy is dusky in color. No new orders at this time

## 2020-11-06 NOTE — Consult Note (Signed)
WOC Nurse ostomy consult note Consult received for new colostomy created emergently on 11/05/20.  WOC Nurse will see Monday, 2/14 for first post op stoma assessment and education session.  WOC nursing team will follow, and will remain available to this patient, the nursing, surgery and medical teams.    Thank you, Ladona Mow, MSN, RN, Perlie Gold, [place]  Pager# [phone]

## 2020-11-06 NOTE — Progress Notes (Signed)
SURGICAL PROGRESS NOTE   Hospital Day(s): 2.   Post op day(s): 2 Days Post-Op.   Interval History: Patient seen and examined, no acute events or new complaints overnight. Patient reports feeling better than yesterday.  He reported he is passing a little bit more gas.  He still not passing any stool.  The pain slightly better controlled.  Denies nausea or vomiting.  Vital signs in last 24 hours: [min-max] current  Temp:  [97.7 F (36.5 C)-98.1 F (36.7 C)] 97.7 F (36.5 C) (02/12 0421) Pulse Rate:  [65-84] 76 (02/12 0421) Resp:  [18-20] 20 (02/12 0421) BP: (115-126)/(73-83) 116/83 (02/12 0421) SpO2:  [94 %-97 %] 95 % (02/12 0421)     Height: 6\' 4"  (193 cm) Weight: 117.9 kg BMI (Calculated): 31.66   Physical Exam:  Constitutional: alert, cooperative and no distress  Respiratory: breathing non-labored at rest  Cardiovascular: regular rate and sinus rhythm  Gastrointestinal: soft, non-tender, and non-distended.  Colostomy pink and patent with expected edema and a small superficial clot.  Labs:  CBC Latest Ref Rng & Units 11/05/2020 11/04/2020  WBC 4.0 - 10.5 K/uL 22.7(H) 22.1(H)  Hemoglobin 13.0 - 17.0 g/dL 01/02/2021 30.0  Hematocrit 92.3 - 52.0 % 39.7 42.6  Platelets 150 - 400 K/uL 185 198   CMP Latest Ref Rng & Units 11/05/2020 11/04/2020  Glucose 70 - 99 mg/dL 01/02/2021) 762(U)  BUN 6 - 20 mg/dL 14 13  Creatinine 633(H - 1.24 mg/dL 5.45 6.25  Sodium 6.38 - 145 mmol/L 136 136  Potassium 3.5 - 5.1 mmol/L 3.9 3.9  Chloride 98 - 111 mmol/L 101 99  CO2 22 - 32 mmol/L 24 25  Calcium 8.9 - 10.3 mg/dL 937) 9.2  Total Protein 6.5 - 8.1 g/dL - 8.6(H)  Total Bilirubin 0.3 - 1.2 mg/dL - 3.4(K)  Alkaline Phos 38 - 126 U/L - 57  AST 15 - 41 U/L - 24  ALT 0 - 44 U/L - 33    Imaging studies: No new pertinent imaging studies   Assessment/Plan:  35 y.o. male with perforated radiculitis 2 Day Post-Op s/p robotic assisted laparoscopic sigmoid colectomy with creation of end colostomy.  Patient  recovering slowly.  No sign of nausea or vomiting.  We will still keep him in clear liquid diet until the GI function improved.  Encourage the patient to ambulate.  We will repeat labs in the morning.  We will continue with DVT prophylaxis.  We will continue with IV antibiotic therapy due to severe intra-abdominal infection during surgery.  31, MD

## 2020-11-07 LAB — BASIC METABOLIC PANEL
Anion gap: 11 (ref 5–15)
BUN: 19 mg/dL (ref 6–20)
CO2: 28 mmol/L (ref 22–32)
Calcium: 8.4 mg/dL — ABNORMAL LOW (ref 8.9–10.3)
Chloride: 96 mmol/L — ABNORMAL LOW (ref 98–111)
Creatinine, Ser: 0.98 mg/dL (ref 0.61–1.24)
GFR, Estimated: >60 mL/min (ref >60)
Glucose, Bld: 111 mg/dL — ABNORMAL HIGH (ref 70–99)
Potassium: 3.8 mmol/L (ref 3.5–5.1)
Sodium: 135 mmol/L (ref 135–145)

## 2020-11-07 LAB — CBC
HCT: 41.3 % (ref 39.0–52.0)
Hemoglobin: 13.9 g/dL (ref 13.0–17.0)
MCH: 31.5 pg (ref 26.0–34.0)
MCHC: 33.7 g/dL (ref 30.0–36.0)
MCV: 93.7 fL (ref 80.0–100.0)
Platelets: 247 10*3/uL (ref 150–400)
RBC: 4.41 MIL/uL (ref 4.22–5.81)
RDW: 11.9 % (ref 11.5–15.5)
WBC: 15.9 10*3/uL — ABNORMAL HIGH (ref 4.0–10.5)
nRBC: 0 % (ref 0.0–0.2)

## 2020-11-07 LAB — MAGNESIUM: Magnesium: 2.3 mg/dL (ref 1.7–2.4)

## 2020-11-07 LAB — PHOSPHORUS: Phosphorus: 3.7 mg/dL (ref 2.5–4.6)

## 2020-11-07 MED ORDER — POLYETHYLENE GLYCOL 3350 17 G PO PACK
17.0000 g | PACK | Freq: Two times a day (BID) | ORAL | Status: DC
  Administered 2020-11-07 – 2020-11-10 (×7): 17 g via ORAL
  Filled 2020-11-07 (×7): qty 1

## 2020-11-07 MED ORDER — OXYCODONE-ACETAMINOPHEN 7.5-325 MG PO TABS
1.0000 | ORAL_TABLET | Freq: Four times a day (QID) | ORAL | Status: DC | PRN
Start: 2020-11-07 — End: 2020-11-13
  Administered 2020-11-07 – 2020-11-13 (×19): 1 via ORAL
  Filled 2020-11-07 (×20): qty 1

## 2020-11-07 NOTE — Progress Notes (Signed)
SURGICAL PROGRESS NOTE   Hospital Day(s): 3.   Post op day(s): 3 Days Post-Op.   Interval History: Patient seen and examined, no acute events or new complaints overnight. Patient reports feeling a little bit better.  He reported that pain continue to improve slowly.  Denies any nausea or vomiting.  Vital signs in last 24 hours: [min-max] current  Temp:  [96.9 F (36.1 C)-98.2 F (36.8 C)] 98 F (36.7 C) (02/13 0826) Pulse Rate:  [79-94] 94 (02/13 0826) Resp:  [14-20] 18 (02/13 0826) BP: (112-131)/(74-86) 125/83 (02/13 0826) SpO2:  [90 %-95 %] 94 % (02/13 0826)     Height: 6\' 4"  (193 cm) Weight: 117.9 kg BMI (Calculated): 31.66   Physical Exam:  Constitutional: alert, cooperative and no distress  Respiratory: breathing non-labored at rest  Cardiovascular: regular rate and sinus rhythm  Gastrointestinal: soft, non-tender, and non-distended.  Colostomy is patent, edematous with superficial ischemic mucosa.  There is pink mucosa upon digital examination.  Labs:  CBC Latest Ref Rng & Units 11/07/2020 11/05/2020 11/04/2020  WBC 4.0 - 10.5 K/uL 15.9(H) 22.7(H) 22.1(H)  Hemoglobin 13.0 - 17.0 g/dL 01/02/2021 93.7 16.9  Hematocrit 39.0 - 52.0 % 41.3 39.7 42.6  Platelets 150 - 400 K/uL 247 185 198   CMP Latest Ref Rng & Units 11/07/2020 11/05/2020 11/04/2020  Glucose 70 - 99 mg/dL 01/02/2021) 938(B) 017(P)  BUN 6 - 20 mg/dL 19 14 13   Creatinine 0.61 - 1.24 mg/dL 102(H 8.52  Sodium 135 - 145 mmol/L 135 136 136  Potassium 3.5 - 5.1 mmol/L 3.8 3.9 3.9  Chloride 98 - 111 mmol/L 96(L) 101 99  CO2 22 - 32 mmol/L 28 24 25   Calcium 8.9 - 10.3 mg/dL 7.78) 2.42) 9.2  Total Protein 6.5 - 8.1 g/dL - - 8.6(H)  Total Bilirubin 0.3 - 1.2 mg/dL - - 1.4(H)  Alkaline Phos 38 - 126 U/L - - 57  AST 15 - 41 U/L - - 24  ALT 0 - 44 U/L - - 33    Imaging studies: No new pertinent imaging studies   Assessment/Plan:  34 y.o.malewith perforated radiculitis3 Day Post-Ops/p robotic assisted laparoscopic  sigmoid colectomy with creation of end colostomy.  Continue recovering slowly.  Pain well controlled.  Continue passing adequate amount of gas.  Still no stool.  I think there there is a component of constipation.  I will give MiraLAX.  The mucosa of the colostomy is very edematous with superficial ischemia otherwise very stable adequate mucosa.  We will continue with conservative management.  We will continue with DVT prophylaxis.  We will continue with IV antibiotic therapy due to significant intra-abdominal infection identified on admission.  I encouraged the patient to ambulate.  6.1(W, MD

## 2020-11-07 NOTE — Progress Notes (Signed)
Patient said that he felt like there was pressure building up in his right abdomen. He also said that the norco is not helping. Patient is encouraged to ambulate.He has been ambulating in his room.  Dr Hazle Quant updated on all of this and changed norco to percocet

## 2020-11-08 NOTE — Progress Notes (Signed)
SURGICAL PROGRESS NOTE   Hospital Day(s): 4.   Post op day(s): 4 Days Post-Op.   Interval History: Patient seen and examined, no acute events or new complaints overnight. Patient reports feeling okay.  Reports some intermittent pain on the suprapubic area relieved with voiding.  Otherwise denies any nausea or vomiting.  Vital signs in last 24 hours: [min-max] current  Temp:  [98 F (36.7 C)-99 F (37.2 C)] 98 F (36.7 C) (02/14 1214) Pulse Rate:  [78-89] 89 (02/14 1214) Resp:  [18] 18 (02/13 1533) BP: (114-127)/(66-85) 123/85 (02/14 1214) SpO2:  [93 %-97 %] 95 % (02/14 1214)     Height: 6\' 4"  (193 cm) Weight: 117.9 kg BMI (Calculated): 31.66   Physical Exam:  Constitutional: alert, cooperative and no distress  Respiratory: breathing non-labored at rest  Cardiovascular: regular rate and sinus rhythm  Gastrointestinal: soft, non-tender, and non-distended.  Colostomy with superficial ischemia.  Patent.  Labs:  CBC Latest Ref Rng & Units 11/07/2020 11/05/2020 11/04/2020  WBC 4.0 - 10.5 K/uL 15.9(H) 22.7(H) 22.1(H)  Hemoglobin 13.0 - 17.0 g/dL 01/02/2021 70.3 50.0  Hematocrit 39.0 - 52.0 % 41.3 39.7 42.6  Platelets 150 - 400 K/uL 247 185 198   CMP Latest Ref Rng & Units 11/07/2020 11/05/2020 11/04/2020  Glucose 70 - 99 mg/dL 01/02/2021) 182(X) 937(J)  BUN 6 - 20 mg/dL 19 14 13   Creatinine 0.61 - 1.24 mg/dL 696(V 8.93  Sodium 135 - 145 mmol/L 135 136 136  Potassium 3.5 - 5.1 mmol/L 3.8 3.9 3.9  Chloride 98 - 111 mmol/L 96(L) 101 99  CO2 22 - 32 mmol/L 28 24 25   Calcium 8.9 - 10.3 mg/dL 8.10) 1.75) 9.2  Total Protein 6.5 - 8.1 g/dL - - 8.6(H)  Total Bilirubin 0.3 - 1.2 mg/dL - - 1.4(H)  Alkaline Phos 38 - 126 U/L - - 57  AST 15 - 41 U/L - - 24  ALT 0 - 44 U/L - - 33    Imaging studies: No new pertinent imaging studies   Assessment/Plan:  34 y.o.malewith perforated radiculitis4Day Post-Ops/p robotic assisted laparoscopic sigmoid colectomy with creation of end  colostomy.  The patient is recovering slowly.  There is no significant change in clinical status.  Patient without significant bowel function yet.  Colostomy was digitalized and patent.  We will continue with pain control management.  We will continue to encourage ambulate.  We will continue with DVT prophylaxis.  Continue with IV antibiotic therapy.  We will keep diet full liquid.  5.8(N, MD

## 2020-11-08 NOTE — Consult Note (Signed)
WOC Nurse ostomy consult note Stoma type/location: LLQ colostomy created emergently on 11/05/20. Patient's wife is present for today's initial pouch change and stoma assessment. They report that she will be assisting in her husband's care, that they were told the ostomy is temporary and that an approximate reanastamosis timeline is 6 months by Dr. Hazle Quant. They tell me they have 4 children under 37 years of age.  Stomal assessment/size: 1 and 1/2 inch oblique, slightly raised with black cap over stoma contributing to strong odor. Red mucosa noted beneath. Peristomal assessment: circular full thickness area of skin loss at 1 o'clock measuring 1cm round with depth undetermined due to yellow fibrinous slough obscuring. Treatment options for stomal/peristomal skin: skin barrier ring Output: small amount serosanguinous effluent. Ostomy pouching: 2pc. 2 and 3/4 inch pouching system with skin barrier ring Education provided:  Explained role of ostomy nurse and creation of stoma  Explained stoma characteristics (budded, flush, color, texture, care) Demonstrated pouch change (cutting new skin barrier, measuring stoma, cleaning peristomal skin and stoma, use of barrier ring) Demonstrated use of wick to clean spout   Patient is very uncomfortable today with abdominal pain and has requested pain medication. He is emotional, but he is appropriate and asking questions.This Clinical research associate answered patient/family questions to their expressed satisfaction.  Will see again tomorrow.  Patient education booklet and supplies left at bedside.    Patient would benefit from [facility] services for continuing education and ongoing assessment as ostomy changes.  If you agree, please order/arrange/involve TOC.  Enrolled patient in [employer] DC program: No   WOC nursing team will follow, and will remain available to this patient, the nursing, surgical and medical teams.   Thanks, Ladona Mow, MSN, RN, GNP, [place]  Pager# [phone]

## 2020-11-09 ENCOUNTER — Inpatient Hospital Stay: Payer: BC Managed Care – PPO

## 2020-11-09 LAB — CULTURE, BLOOD (SINGLE)
Culture: NO GROWTH
Special Requests: ADEQUATE

## 2020-11-09 LAB — SURGICAL PATHOLOGY

## 2020-11-09 MED ORDER — IOHEXOL 9 MG/ML PO SOLN
500.0000 mL | ORAL | Status: AC
  Administered 2020-11-09 (×2): 500 mL via ORAL

## 2020-11-09 MED ORDER — IOHEXOL 300 MG/ML  SOLN
125.0000 mL | Freq: Once | INTRAMUSCULAR | Status: AC | PRN
  Administered 2020-11-09: 125 mL via INTRAVENOUS

## 2020-11-09 NOTE — Consult Note (Signed)
Chief Complaint: Patient was seen in consultation today for  Chief Complaint  Patient presents with  . Abdominal Pain    Referring Physician(s): Dr. Hazle Quant  Supervising Physician: Mir, Mauri Reading  Patient Status: [facility] - In-pt  History of Present Illness: Mark Spencer is a 35 y.o. male with a no significant past medical history. He presented to the [facility] ED 11/04/20 with worsening abdominal pain and CT imaging showed a perforated sigmoid diverticulitis. He was taken to the OR 11/05/20 for a robotic-assisted laparoscopic sigmoid colectomy with creation of end colostomy. Repeat CT imaging today demonstrates an intra-abdominal fluid collection.  CT Abdomen/Pelvis 11/09/20 Other: There is a 9.2 x 5.1 cm partially rim enhancing collection in the patient's pelvis abutting the surgical staple line involving the rectum (axial series 2, image 82). There are extensive inflammatory changes in the patient's abdomen and pelvis. There are few pockets of pneumoperitoneum, slightly improved from prior study. A surgical drain is noted. IMPRESSION: 1. Status post sigmoid colectomy and left lower quadrant ostomy formation. There is a partially rim enhancing fluid collection adjacent to the Hartmann's pouch as detailed above. This is concerning for a postoperative seroma/hematoma versus a developing abscess. Given the lack of air within this collection, an anastomotic leak is felt to be less likely. 2. Interval development of a small-bowel obstruction with a transition point in the mid abdomen. At this location, there are extensive inflammatory changes and wall thickening of several small bowel loops suggestive of a reactive enteritis. 3. Small volume pneumoperitoneum, improved from prior study. 4. Bibasilar atelectasis, right greater than left.  Interventional Radiology has been asked to evaluate this patient for an image-guided pelvic fluid collection aspiration with drain placement. This  case has been reviewed and procedure approved by Dr. Bryn Gulling.   History reviewed. No pertinent past medical history.  History reviewed. No pertinent surgical history.  Allergies: Patient has no known allergies.  Medications: Prior to Admission medications   Not on File     History reviewed. No pertinent family history.  Social History   Socioeconomic History  . Marital status: Unknown    Spouse name: Not on file  . Number of children: Not on file  . Years of education: Not on file  . Highest education level: Not on file  Occupational History  . Not on file  Tobacco Use  . Smoking status: Never Smoker  . Smokeless tobacco: Never Used  Substance and Sexual Activity  . Alcohol use: Yes  . Drug use: Never  . Sexual activity: Not on file  Other Topics Concern  . Not on file  Social History Narrative  . Not on file   Social Determinants of Health   Financial Resource Strain: Not on file  Food Insecurity: Not on file  Transportation Needs: Not on file  Physical Activity: Not on file  Stress: Not on file  Social Connections: Not on file    Review of Systems: A 12 point ROS discussed and pertinent positives are indicated in the HPI above.  All other systems are negative.  Review of Systems  Constitutional: Positive for appetite change and fatigue.  Respiratory: Positive for shortness of breath. Negative for cough.        Due to abdominal bloating  Cardiovascular: Negative for chest pain and leg swelling.  Gastrointestinal: Positive for abdominal distention and abdominal pain. Negative for nausea and vomiting.  Musculoskeletal: Negative for back pain.  Neurological: Negative for headaches.    Vital Signs: BP 118/73 (BP Location: Left  Arm)   Pulse 88   Temp 97.8 F (36.6 C) (Oral)   Resp 16   Ht 6\' 4"  (1.93 m)   Wt 260 lb (117.9 kg)   SpO2 95%   BMI 31.65 kg/m   Physical Exam Constitutional:      General: He is not in acute distress. HENT:      Mouth/Throat:     Mouth: Mucous membranes are moist.     Pharynx: Oropharynx is clear.  Cardiovascular:     Rate and Rhythm: Normal rate and regular rhythm.  Pulmonary:     Effort: Pulmonary effort is normal.     Breath sounds: Normal breath sounds.  Abdominal:     General: Bowel sounds are normal. There is distension.     Comments: Laparoscopic incisions x3 closed with staples. Left abdominal colostomy; scant amount of blood and air in colostomy bag. RLQ JP drain with approximately 10 ml of serosanguineous fluid in bulb. Site is covered with gauze; clean and dry.   Musculoskeletal:     Right lower leg: No edema.     Left lower leg: No edema.  Skin:    General: Skin is warm and dry.  Neurological:     Mental Status: He is alert and oriented to person, place, and time.  Psychiatric:        Mood and Affect: Mood normal.        Behavior: Behavior normal.        Thought Content: Thought content normal.        Judgment: Judgment normal.     Imaging: CT ABDOMEN PELVIS W CONTRAST  Result Date: 11/09/2020 CLINICAL DATA:  Abdominal abscess suspected. EXAM: CT ABDOMEN AND PELVIS WITH CONTRAST TECHNIQUE: Multidetector CT imaging of the abdomen and pelvis was performed using the standard protocol following bolus administration of intravenous contrast. CONTRAST:  11/11/2020 OMNIPAQUE IOHEXOL 300 MG/ML  SOLN COMPARISON:  CT dated 11/04/2020 FINDINGS: Lower chest: There is atelectasis at the lung bases, right worse than left.The heart size is normal. Hepatobiliary: The liver is normal. Normal gallbladder.There is no biliary ductal dilation. Pancreas: Normal contours without ductal dilatation. No peripancreatic fluid collection. Spleen: Unremarkable. Adrenals/Urinary Tract: --Adrenal glands: Unremarkable. --Right kidney/ureter: No hydronephrosis or radiopaque kidney stones. --Left kidney/ureter: No hydronephrosis or radiopaque kidney stones. --Urinary bladder: Unremarkable. Stomach/Bowel: --Stomach/Duodenum:  No hiatal hernia or other gastric abnormality. Normal duodenal course and caliber. --Small bowel: There are dilated loops of small bowel consistent with a small-bowel obstruction. There appears to be a transition point in the mid abdomen. At this location, there is circumferential wall thickening of several small bowel loops. --Colon: Patient is status post prior sigmoid colectomy with a left lower quadrant ostomy. --Appendix: Not visualized. No right lower quadrant inflammation or free fluid. Vascular/Lymphatic: Normal course and caliber of the major abdominal vessels. --No retroperitoneal lymphadenopathy. --No mesenteric lymphadenopathy. --No pelvic or inguinal lymphadenopathy. Reproductive: Unremarkable Other: There is a 9.2 x 5.1 cm partially rim enhancing collection in the patient's pelvis abutting the surgical staple line involving the rectum (axial series 2, image 82). There are extensive inflammatory changes in the patient's abdomen and pelvis. There are few pockets of pneumoperitoneum, slightly improved from prior study. A surgical drain is noted. Musculoskeletal. No acute displaced fractures. IMPRESSION: 1. Status post sigmoid colectomy and left lower quadrant ostomy formation. There is a partially rim enhancing fluid collection adjacent to the Hartmann's pouch as detailed above. This is concerning for a postoperative seroma/hematoma versus a developing abscess. Given the lack  of air within this collection, an anastomotic leak is felt to be less likely. 2. Interval development of a small-bowel obstruction with a transition point in the mid abdomen. At this location, there are extensive inflammatory changes and wall thickening of several small bowel loops suggestive of a reactive enteritis. 3. Small volume pneumoperitoneum, improved from prior study. 4. Bibasilar atelectasis, right greater than left. Electronically Signed   By: Katherine Mantlehristopher  Green M.D.   On: 11/09/2020 15:24   CT Abdomen Pelvis W  Contrast  Result Date: 11/04/2020 CLINICAL DATA:  35 year old male with right lower quadrant abdominal pain. EXAM: CT ABDOMEN AND PELVIS WITH CONTRAST TECHNIQUE: Multidetector CT imaging of the abdomen and pelvis was performed using the standard protocol following bolus administration of intravenous contrast. CONTRAST:  125mL OMNIPAQUE IOHEXOL 300 MG/ML  SOLN COMPARISON:  None. FINDINGS: Lower chest: The visualized lung bases are clear. Scattered pneumoperitoneum and trace free fluid in the pelvis. Hepatobiliary: Mild fatty liver. No intrahepatic biliary dilatation. The gallbladder is unremarkable. Pancreas: Unremarkable. No pancreatic ductal dilatation or surrounding inflammatory changes. Spleen: Normal in size without focal abnormality. Adrenals/Urinary Tract: The adrenal glands unremarkable the kidneys, visualized ureters, and urinary bladder appear unremarkable. Stomach/Bowel: There is sigmoid diverticulosis without active inflammatory changes. There is extraluminal air consistent with perforation. No drainable fluid collection or abscess. Inflammatory changes of distal small bowel, reactive to sigmoid diverticulitis. There is no bowel obstruction. The appendix is normal. Vascular/Lymphatic: The abdominal aorta IVC unremarkable. No portal venous gas. There is no adenopathy. Reproductive: The prostate and seminal vesicles are grossly unremarkable. No pelvic mass. Other: None Musculoskeletal: No acute or significant osseous findings. IMPRESSION: 1. Perforated sigmoid diverticulitis. No drainable fluid collection or abscess. 2. Reactive changes of distal small bowel. No bowel obstruction. Normal appendix. 3. Mild fatty liver. These results were called by telephone at the time of interpretation on 11/04/2020 at 5:10 pm to provider [facility] , who verbally acknowledged these results. Electronically Signed   By: Elgie CollardArash  Radparvar M.D.   On: 11/04/2020 17:12    Labs:  CBC: Recent Labs    11/04/20 1530  11/05/20 0430 11/07/20 0525  WBC 22.1* 22.7* 15.9*  HGB 14.6 13.8 13.9  HCT 42.6 39.7 41.3  PLT 198 185 247    COAGS: No results for input(s): INR, APTT in the last 8760 hours.  BMP: Recent Labs    11/04/20 1530 11/05/20 0430 11/07/20 0525  NA 136 136 135  K 3.9 3.9 3.8  CL 99 101 96*  CO2 25 24 28   GLUCOSE 118* 171* 111*  BUN 13 14 19   CALCIUM 9.2 8.4* 8.4*  CREATININE 0.91 0.93 0.98  GFRNONAA >60 >60 >60    LIVER FUNCTION TESTS: Recent Labs    11/04/20 1530  BILITOT 1.4*  AST 24  ALT 33  ALKPHOS 57  PROT 8.6*  ALBUMIN 3.9    TUMOR MARKERS: No results for input(s): AFPTM, CEA, CA199, CHROMGRNA in the last 8760 hours.  Assessment and Plan:  Status post sigmoid colectomy and LLQ ostomy formation; pelvic fluid collection: Hart RobinsonsJoshua Spencer, 35 year old male, is tentatively scheduled tomorrow for an image-guided pelvic fluid collection aspiration with drain placement.   Risks and benefits discussed with the patient including bleeding, infection, damage to adjacent structures, bowel perforation/fistula connection, and sepsis.  All of the patient's questions were answered, patient is agreeable to proceed.  The patient will be NPO after midnight. AM labs are ordered. Lovenox will be held.   Consent signed and in chart.  Thank  you for this interesting consult.  I greatly enjoyed meeting Mark Spencer and look forward to participating in their care.  A copy of this report was sent to the requesting provider on this date.  Electronically Signed: Alwyn Ren, AGACNP-BC [phone] 11/09/2020, 4:16 PM   I spent a total of 20 Minutes    in face to face in clinical consultation, greater than 50% of which was counseling/coordinating care for pelvic fluid collection aspiration with drain placement.

## 2020-11-09 NOTE — Progress Notes (Signed)
SURGICAL PROGRESS NOTE   Hospital Day(s): 5.   Post op day(s): 5 Days Post-Op.   Interval History: Patient seen and examined, no acute events or new complaints overnight. Patient reports feeling bloated.  He denies any nausea or vomiting.  He feels pressure in the lower abdomen.  Denies any fever or chills.  Vital signs in last 24 hours: [min-max] current  Temp:  [97.4 F (36.3 C)-98.4 F (36.9 C)] 97.8 F (36.6 C) (02/15 1536) Pulse Rate:  [82-98] 88 (02/15 1536) Resp:  [15-18] 16 (02/15 1536) BP: (113-124)/(73-83) 118/73 (02/15 1536) SpO2:  [93 %-97 %] 95 % (02/15 1536)     Height: 6\' 4"  (193 cm) Weight: 117.9 kg BMI (Calculated): 31.66   Physical Exam:  Constitutional: alert, cooperative and no distress  Respiratory: breathing non-labored at rest  Cardiovascular: regular rate and sinus rhythm  Gastrointestinal: soft, non-tender, and istended  Labs:  CBC Latest Ref Rng & Units 11/07/2020 11/05/2020 11/04/2020  WBC 4.0 - 10.5 K/uL 15.9(H) 22.7(H) 22.1(H)  Hemoglobin 13.0 - 17.0 g/dL 01/02/2021 28.3 66.2  Hematocrit 39.0 - 52.0 % 41.3 39.7 42.6  Platelets 150 - 400 K/uL 247 185 198   CMP Latest Ref Rng & Units 11/07/2020 11/05/2020 11/04/2020  Glucose 70 - 99 mg/dL 01/02/2021) 654(Y) 503(T)  BUN 6 - 20 mg/dL 19 14 13   Creatinine 0.61 - 1.24 mg/dL 465(K 8.12  Sodium 135 - 145 mmol/L 135 136 136  Potassium 3.5 - 5.1 mmol/L 3.8 3.9 3.9  Chloride 98 - 111 mmol/L 96(L) 101 99  CO2 22 - 32 mmol/L 28 24 25   Calcium 8.9 - 10.3 mg/dL 7.51) 7.00) 9.2  Total Protein 6.5 - 8.1 g/dL - - 8.6(H)  Total Bilirubin 0.3 - 1.2 mg/dL - - 1.4(H)  Alkaline Phos 38 - 126 U/L - - 57  AST 15 - 41 U/L - - 24  ALT 0 - 44 U/L - - 33    Imaging studies: I personally evaluated the CT of the abdomen and pelvis.  This shows a large fluid collection.  I discussed with IR for possible percutaneous drainage.   Assessment/Plan:  35 y.o.malewith perforated radiculitis4Day Post-Ops/p robotic assisted  laparoscopic sigmoid colectomy with creation of end colostomy.  Patient with new intra-abdominal abscess.  This is expected after perforated diverticulitis with purulent peritonitis.  This is causing an ileus.  I will hold diet for today.  I discussed the case with IR for possible percutaneous drainage tomorrow.  We will continue with IV antibiotic therapy.  We will continue with DVT prophylaxis.  I encouraged the patient to ambulate.  1.7(C, MD

## 2020-11-09 NOTE — TOC Initial Note (Signed)
Transition of Care [facility]) - Initial/Assessment Note    Patient Details  Name: Mark Spencer MRN: [id number] Date of Birth: [birth year]  Transition of Care [facility] CM/SW Contact:    Chapman Fitch, RN Phone Number: 11/09/2020, 1:52 PM  Clinical Narrative:                 Patient with new ostomy this admission. Patient ha received education from the [facility] RN.  He states that the education has been "100% A+"  Discussed the option of home health.  Patient states that he is unsure now if that is something that he would like to pursue.  He states that he just came back from CT scan, and is currently in pain.  Requests that I follow up tomorrow  Expected Discharge Plan: Home/Self Care Barriers to Discharge: Continued Medical Work up   Patient Goals and CMS Choice        Expected Discharge Plan and Services Expected Discharge Plan: Home/Self Care                                              Prior Living Arrangements/Services                       Activities of Daily Living Home Assistive Devices/Equipment: None ADL Screening (condition at time of admission) Patient's cognitive ability adequate to safely complete daily activities?: Yes Is the patient deaf or have difficulty hearing?: No Does the patient have difficulty seeing, even when wearing glasses/contacts?: No Does the patient have difficulty concentrating, remembering, or making decisions?: No Patient able to express need for assistance with ADLs?: Yes Does the patient have difficulty dressing or bathing?: No Independently performs ADLs?: Yes (appropriate for developmental age) Does the patient have difficulty walking or climbing stairs?: No Weakness of Legs: None Weakness of Arms/Hands: None  Permission Sought/Granted                  Emotional Assessment       Orientation: : Oriented to Self,Oriented to Place,Oriented to  Time,Oriented to Situation      Admission diagnosis:  Diverticulitis  of colon with perforation [K57.20] Patient Active Problem List   Diagnosis Date Noted  . Diverticulitis of colon with perforation 11/04/2020   PCP:  Patient, No Pcp Per Pharmacy:  No Pharmacies Listed    Social Determinants of Health (SDOH) Interventions    Readmission Risk Interventions No flowsheet data found.

## 2020-11-09 NOTE — Consult Note (Signed)
WOC Nurse ostomy follow up Stoma type/location: LLQ colostomy.  POD 4.  Pouching system applied yesterday is intact. Wife present for session.  Patient reports that he will have another CT scan today related to his RLQ continuing discomfort. Stomal assessment/size: Per yesterday's measurement, 1 and 1/2 inch slightly oblique Peristomal assessment: not seen today. Treatment options for stomal/peristomal skin:  Output: small serosanguinous.  No flatus, no stool. Ostomy pouching: 2pc. 2 and 3/4 inch pouching system with skin barrier ring.  Dr. Hazle Quant digitalized stoma today and it is patent. Education provided:  Demonstrated "burping" flatus from pouch Discussed bathing, diet, gas, medication use, constipation Discussed risk of peristomal hernia  Answered patient/family questions about  Secure Start program. I share the contents of the education folder today including the 1-page tip sheet on the pouch change procedure I demonstrated yesterday.  Enrolled patient in [place] Secure Start Discharge program: Yes, today.  4 skin barriers, 4 opaque pouches with integrated gas filters, 4 closed end, opaque pouches with integrated gas filters and 4 skin barrier rings requested.  Also ostomy powder and a belt.  Will see again tomorrow.  WOC nursing team will follow, and will remain available to this patient, the nursing, surgical and medical teams.  Thanks, Ladona Mow, MSN, RN, GNP, [place]  Pager# [phone]

## 2020-11-10 ENCOUNTER — Inpatient Hospital Stay: Payer: BC Managed Care – PPO

## 2020-11-10 LAB — CBC
HCT: 39.2 % (ref 39.0–52.0)
Hemoglobin: 13.5 g/dL (ref 13.0–17.0)
MCH: 31.9 pg (ref 26.0–34.0)
MCHC: 34.4 g/dL (ref 30.0–36.0)
MCV: 92.7 fL (ref 80.0–100.0)
Platelets: 261 10*3/uL (ref 150–400)
RBC: 4.23 MIL/uL (ref 4.22–5.81)
RDW: 12 % (ref 11.5–15.5)
WBC: 23.3 10*3/uL — ABNORMAL HIGH (ref 4.0–10.5)
nRBC: 0 % (ref 0.0–0.2)

## 2020-11-10 LAB — PROTIME-INR
INR: 1.4 — ABNORMAL HIGH (ref 0.8–1.2)
Prothrombin Time: 16.5 seconds — ABNORMAL HIGH (ref 11.4–15.2)

## 2020-11-10 MED ORDER — FENTANYL CITRATE (PF) 100 MCG/2ML IJ SOLN
INTRAMUSCULAR | Status: DC | PRN
  Administered 2020-11-10 (×2): 50 ug via INTRAVENOUS

## 2020-11-10 MED ORDER — SODIUM CHLORIDE 0.9 % IV SOLN
INTRAVENOUS | Status: DC | PRN
  Administered 2020-11-10: 10 mL/h via INTRAVENOUS

## 2020-11-10 MED ORDER — MIDAZOLAM HCL 2 MG/2ML IJ SOLN
INTRAMUSCULAR | Status: AC
  Filled 2020-11-10: qty 2

## 2020-11-10 MED ORDER — FENTANYL CITRATE (PF) 100 MCG/2ML IJ SOLN
INTRAMUSCULAR | Status: AC
  Filled 2020-11-10: qty 2

## 2020-11-10 MED ORDER — TRAMADOL HCL 50 MG PO TABS
50.0000 mg | ORAL_TABLET | Freq: Once | ORAL | Status: AC
  Administered 2020-11-10: 50 mg via ORAL
  Filled 2020-11-10: qty 1

## 2020-11-10 MED ORDER — MIDAZOLAM HCL 2 MG/2ML IJ SOLN
INTRAMUSCULAR | Status: DC | PRN
  Administered 2020-11-10 (×2): 1 mg via INTRAVENOUS

## 2020-11-10 MED ORDER — SODIUM CHLORIDE 0.9% FLUSH
5.0000 mL | Freq: Three times a day (TID) | INTRAVENOUS | Status: DC
  Administered 2020-11-10 – 2020-11-13 (×9): 5 mL

## 2020-11-10 NOTE — Consult Note (Addendum)
WOC Nurse ostomy follow up Results of CT scan and Dr. Will Bonnet note from last evening are appreciated. Patient is NPO and scheduled for possible percutaneous drainage of an intra abdominal abscess today that is causing an ileus.    I will not perform pouch change today, but will check patient tomorrow and perform with wife at that time. Supplies in room.  WOC nursing team will follow, and will remain available to this patient and his wife as well as the nursing, surgical and medical teams.   Thanks, Ladona Mow, MSN, RN, GNP, [place]  Pager# [phone]

## 2020-11-10 NOTE — Progress Notes (Signed)
SURGICAL PROGRESS NOTE   Hospital Day(s): 6.   Post op day(s): 6 Days Post-Op.   Interval History: Patient seen and examined, no acute events or new complaints overnight. Patient reports feeling better after drainage of intra-abdominal abscess.  Denies nausea or vomiting.  Report continue passing gas through the colostomy.  Still no stool.  Vital signs in last 24 hours: [min-max] current  Temp:  [97.6 F (36.4 C)-98.2 F (36.8 C)] 97.7 F (36.5 C) (02/16 1619) Pulse Rate:  [81-94] 81 (02/16 1619) Resp:  [13-24] 16 (02/16 1619) BP: (109-129)/(64-90) 129/81 (02/16 1619) SpO2:  [93 %-98 %] 95 % (02/16 1619)     Height: 6\' 4"  (193 cm) Weight: 117.9 kg BMI (Calculated): 31.66   Physical Exam:  Constitutional: alert, cooperative and no distress  Respiratory: breathing non-labored at rest  Cardiovascular: regular rate and sinus rhythm  Gastrointestinal: soft, non-tender, and distended.  Colostomy patent  Labs:  CBC Latest Ref Rng & Units 11/10/2020 11/07/2020 11/05/2020  WBC 4.0 - 10.5 K/uL 23.3(H) 15.9(H) 22.7(H)  Hemoglobin 13.0 - 17.0 g/dL 01/03/2021 70.2 63.7  Hematocrit 39.0 - 52.0 % 39.2 41.3 39.7  Platelets 150 - 400 K/uL 261 247 185   CMP Latest Ref Rng & Units 11/07/2020 11/05/2020 11/04/2020  Glucose 70 - 99 mg/dL 01/02/2021) 850(Y) 774(J)  BUN 6 - 20 mg/dL 19 14 13   Creatinine 0.61 - 1.24 mg/dL 287(O 6.76  Sodium 135 - 145 mmol/L 135 136 136  Potassium 3.5 - 5.1 mmol/L 3.8 3.9 3.9  Chloride 98 - 111 mmol/L 96(L) 101 99  CO2 22 - 32 mmol/L 28 24 25   Calcium 8.9 - 10.3 mg/dL 7.20) 9.47) 9.2  Total Protein 6.5 - 8.1 g/dL - - 8.6(H)  Total Bilirubin 0.3 - 1.2 mg/dL - - 1.4(H)  Alkaline Phos 38 - 126 U/L - - 57  AST 15 - 41 U/L - - 24  ALT 0 - 44 U/L - - 33    Imaging studies: No new pertinent imaging studies   Assessment/Plan:  34 y.o.malewith perforated radiculitis6Day Post-Ops/p robotic assisted laparoscopic sigmoid colectomy with creation of end  colostomy.  Patient with new intra-abdominal abscess that is post IR drainage today.  Procedure tolerated well.  We will see how he responds hoping that this will help to improve the ileus.  He still passing gas so something is moving.  Will saying clear liquid diet.  We will continue with IV antibiotic therapy.  Encourage the patient to ambulate.  2.8(Z, MD

## 2020-11-10 NOTE — Procedures (Signed)
Interventional Radiology Procedure:   Indications: Post operative fluid collection in lower abdomen/pelvis.  History of perforated colonic diverticulitis with colostomy.   Procedure: CT guided drain placement  Findings: Placed 10 Fr drain and aspirated 50 ml of cloudy bloody fluid.  Complications: None     EBL: Minimal  Plan: Send fluid for culture and follow output.     Toney Lizaola R. Lowella Dandy, MD  Pager: [phone]

## 2020-11-11 NOTE — Progress Notes (Signed)
Referring Physician(s): Dr. Hazle Quant  Supervising Physician: Ruel Favors  Patient Status:  [facility] - In-pt  Chief Complaint: Perforated colonic diverticulitis s/p colostomy; development of intra-abdominal abscess s/p drain placement in IR 11/10/20  Subjective: The patient is sitting up in the chair, his wife is at the bedside. He states he feels so much better since the drain was placed. Drain care teaching done with the patient and his wife - demonstrated how to empty and re-charge bulb, demonstrated how to flush the drain.   Allergies: Patient has no known allergies.  Medications: Prior to Admission medications   Not on File     Vital Signs: BP 121/76 (BP Location: Left Arm)   Pulse 76   Temp 98 F (36.7 C) (Oral)   Resp 15   Ht 6\' 4"  (1.93 m)   Wt 260 lb (117.9 kg)   SpO2 97%   BMI 31.65 kg/m   Physical Exam Constitutional:      General: He is not in acute distress. Pulmonary:     Effort: Pulmonary effort is normal.  Abdominal:     Comments: Lower/mid abdominal/pelvic drain to suction. Approximately 20 ml of light, thin sanguineous fluid in bulb. Drain easily flushed and aspirated with 10 ml NS. Dressing is clean and dry. Colostomy unremarkable.   Skin:    General: Skin is warm and dry.  Neurological:     Mental Status: He is alert and oriented to person, place, and time.     Imaging: CT ABDOMEN PELVIS W CONTRAST  Result Date: 11/09/2020 CLINICAL DATA:  Abdominal abscess suspected. EXAM: CT ABDOMEN AND PELVIS WITH CONTRAST TECHNIQUE: Multidetector CT imaging of the abdomen and pelvis was performed using the standard protocol following bolus administration of intravenous contrast. CONTRAST:  11/11/2020 OMNIPAQUE IOHEXOL 300 MG/ML  SOLN COMPARISON:  CT dated 11/04/2020 FINDINGS: Lower chest: There is atelectasis at the lung bases, right worse than left.The heart size is normal. Hepatobiliary: The liver is normal. Normal gallbladder.There is no biliary ductal  dilation. Pancreas: Normal contours without ductal dilatation. No peripancreatic fluid collection. Spleen: Unremarkable. Adrenals/Urinary Tract: --Adrenal glands: Unremarkable. --Right kidney/ureter: No hydronephrosis or radiopaque kidney stones. --Left kidney/ureter: No hydronephrosis or radiopaque kidney stones. --Urinary bladder: Unremarkable. Stomach/Bowel: --Stomach/Duodenum: No hiatal hernia or other gastric abnormality. Normal duodenal course and caliber. --Small bowel: There are dilated loops of small bowel consistent with a small-bowel obstruction. There appears to be a transition point in the mid abdomen. At this location, there is circumferential wall thickening of several small bowel loops. --Colon: Patient is status post prior sigmoid colectomy with a left lower quadrant ostomy. --Appendix: Not visualized. No right lower quadrant inflammation or free fluid. Vascular/Lymphatic: Normal course and caliber of the major abdominal vessels. --No retroperitoneal lymphadenopathy. --No mesenteric lymphadenopathy. --No pelvic or inguinal lymphadenopathy. Reproductive: Unremarkable Other: There is a 9.2 x 5.1 cm partially rim enhancing collection in the patient's pelvis abutting the surgical staple line involving the rectum (axial series 2, image 82). There are extensive inflammatory changes in the patient's abdomen and pelvis. There are few pockets of pneumoperitoneum, slightly improved from prior study. A surgical drain is noted. Musculoskeletal. No acute displaced fractures. IMPRESSION: 1. Status post sigmoid colectomy and left lower quadrant ostomy formation. There is a partially rim enhancing fluid collection adjacent to the Hartmann's pouch as detailed above. This is concerning for a postoperative seroma/hematoma versus a developing abscess. Given the lack of air within this collection, an anastomotic leak is felt to be less likely. 2.  Interval development of a small-bowel obstruction with a transition point  in the mid abdomen. At this location, there are extensive inflammatory changes and wall thickening of several small bowel loops suggestive of a reactive enteritis. 3. Small volume pneumoperitoneum, improved from prior study. 4. Bibasilar atelectasis, right greater than left. Electronically Signed   By: Katherine Mantle M.D.   On: 11/09/2020 15:24   CT IMAGE GUIDED DRAINAGE BY PERCUTANEOUS CATHETER  Result Date: 11/10/2020 INDICATION: 35 year old with history of perforated diverticulitis. Patient is status post sigmoid colectomy with creation of end colostomy. Patient has a postoperative fluid collection in lower abdomen/pelvis. Request for CT-guided drainage of the fluid collection. EXAM: CT-GUIDED DRAIN PLACEMENT WITHIN THE LOWER ABDOMINAL FLUID COLLECTION MEDICATIONS: Moderate sedation ANESTHESIA/SEDATION: Fentanyl 100 mcg IV; Versed 2.0 mg IV Moderate Sedation Time:  22 minutes The patient was continuously monitored during the procedure by the interventional radiology nurse under my direct supervision. COMPLICATIONS: None immediate. PROCEDURE: Informed written consent was obtained from the patient after a thorough discussion of the procedural risks, benefits and alternatives. All questions were addressed. A timeout was performed prior to the initiation of the procedure. Patient was placed supine. CT images through the lower abdomen and pelvis were obtained. Fluid collection was identified. Anterior abdomen was prepped with chlorhexidine and sterile field was created. Maximal barrier sterile technique was utilized including caps, mask, sterile gowns, sterile gloves, sterile drape, hand hygiene and skin antiseptic. Skin was anesthetized using 1% lidocaine. Small incision was made. Using CT guidance, an 18 gauge trocar needle was directed into the fluid collection. Red fluid was aspirated. Superstiff Amplatz wire was advanced into the collection and follow up CT images confirmed placement within the  collection. Tract was dilated over the wire to accommodate a 10 [place] drain. Approximately 50 mL of cloudy bloody fluid was removed from the collection. Follow up CT images were obtained. Catheter was sutured to skin and attached to suction bulb. Dressing was placed. FINDINGS: Fluid collection in the central aspect of the lower abdomen and pelvis. 10 French drain was placed within the collection and 50 mL of bloody fluid was removed. IMPRESSION: CT-guided placement of a drainage catheter within the lower abdominal/pelvic fluid collection. Fluid was sent for culture. Electronically Signed   By: Richarda Overlie M.D.   On: 11/10/2020 12:41    Labs:  CBC: Recent Labs    11/04/20 1530 11/05/20 0430 11/07/20 0525 11/10/20 0542  WBC 22.1* 22.7* 15.9* 23.3*  HGB 14.6 13.8 13.9 13.5  HCT 42.6 39.7 41.3 39.2  PLT 198 185 247 261    COAGS: Recent Labs    11/10/20 0542  INR 1.4*    BMP: Recent Labs    11/04/20 1530 11/05/20 0430 11/07/20 0525  NA 136 136 135  K 3.9 3.9 3.8  CL 99 101 96*  CO2 25 24 28   GLUCOSE 118* 171* 111*  BUN 13 14 19   CALCIUM 9.2 8.4* 8.4*  CREATININE 0.91 0.93 0.98  GFRNONAA >60 >60 >60    LIVER FUNCTION TESTS: Recent Labs    11/04/20 1530  BILITOT 1.4*  AST 24  ALT 33  ALKPHOS 57  PROT 8.6*  ALBUMIN 3.9    Assessment and Plan:  Lower abdominal/pelvic fluid collection s/p abscess drain placement 11/10/20: He is afebrile and states he feels noticeably better since the drain was placed yesterday. Epic output shows 95 ml output with another 20 ml in the bulb. The drain was easily flushed and aspirated. Site is clean and dry.  Drain teaching was done with the patient and his wife - demonstrated flushing and re-charging the bulb. We discussed possible outpatient follow up if he is to be discharged with the drain in place. While inpatient, please continue flushing the drain three times daily and documenting the output. Dressing change daily or as needed.  Will consider re-imaging if output decreases to 10 ml or less per 24 hours not counting flush material.   Other plans per primary teams, please call IR with any questions.   Electronically Signed: Alwyn Ren, AGACNP-BC [phone] 11/11/2020, 2:14 PM   I spent a total of 15 Minutes at the the patient's bedside AND on the patient's [facility] floor or unit, greater than 50% of which was counseling/coordinating care for intra-abdominal abscess drain care.

## 2020-11-11 NOTE — Consult Note (Addendum)
WOC Nurse ostomy follow up Stoma type/location: LLQ colostomy. POD 6 Stomal assessment/size: 1 and 3/8 inch and slightly oblique. Still with black nonviable cap, viable stoma beneath. Peristomal assessment: Intact Treatment options for stomal/peristomal skin: skin barrier ring Output: serosanguinous effluent with fecal odor Ostomy pouching: 2pc. 2 and 3/4 inch pouching system with skin barrier ring  Education provided:  Patient's wife emptied pouch and cleaned bottom of pouch with toilet paper today. They are prepared to transition this to patient either later in admission or at home. Wife (with cueing from [facility] Nurse and patient following along with instructions) removed pouch and cleansed stoma and peristomal skin. WOC Nurse sized stoma and wife traced and cut skin barrier .  Placed skin barrier ring on back of skin barrier, placed over stoma.  Attached pouch.  Closed with Lock and Roll closure.  Supplies provided for discharge. 5 pouches, 5 skin barriers, 5 skin barrier rings plus a bottle of M9 spray for odor elimination.  Enrolled patient in [place] Secure Start Discharge program: Yes, previously.  WOC nursing team will follow, and will remain available to this patient, the nursing and medical teams.  Thanks, Ladona Mow, MSN, RN, GNP, [place]  Pager# [phone]

## 2020-11-11 NOTE — Progress Notes (Signed)
SURGICAL PROGRESS NOTE   Hospital Day(s): 7.   Post op day(s): 7 Days Post-Op.   Interval History: Patient seen and examined, no acute events or new complaints overnight. Patient reports feeling better than yesterday.  He reports that he is passing stool through the colostomy.  He denies any nausea or vomiting.  Continue with pressure in right lower abdomen.  Vital signs in last 24 hours: [min-max] current  Temp:  [97.6 F (36.4 C)-98.2 F (36.8 C)] 97.9 F (36.6 C) (02/17 0348) Pulse Rate:  [71-89] 71 (02/17 0348) Resp:  [13-20] 18 (02/17 0348) BP: (116-129)/(68-90) 116/68 (02/17 0348) SpO2:  [93 %-96 %] 95 % (02/17 0348)     Height: 6\' 4"  (193 cm) Weight: 117.9 kg BMI (Calculated): 31.66   Physical Exam:  Constitutional: alert, cooperative and no distress  Respiratory: breathing non-labored at rest  Cardiovascular: regular rate and sinus rhythm  Gastrointestinal: soft, non-tender, and non-distended.  Colostomy pink and patent.  Wounds are dry and clean.  Labs:  CBC Latest Ref Rng & Units 11/10/2020 11/07/2020 11/05/2020  WBC 4.0 - 10.5 K/uL 23.3(H) 15.9(H) 22.7(H)  Hemoglobin 13.0 - 17.0 g/dL 01/03/2021 24.4 01.0  Hematocrit 39.0 - 52.0 % 39.2 41.3 39.7  Platelets 150 - 400 K/uL 261 247 185   CMP Latest Ref Rng & Units 11/07/2020 11/05/2020 11/04/2020  Glucose 70 - 99 mg/dL 01/02/2021) 536(U) 440(H)  BUN 6 - 20 mg/dL 19 14 13   Creatinine 0.61 - 1.24 mg/dL 474(Q 5.95  Sodium 135 - 145 mmol/L 135 136 136  Potassium 3.5 - 5.1 mmol/L 3.8 3.9 3.9  Chloride 98 - 111 mmol/L 96(L) 101 99  CO2 22 - 32 mmol/L 28 24 25   Calcium 8.9 - 10.3 mg/dL 6.38) 7.56) 9.2  Total Protein 6.5 - 8.1 g/dL - - 8.6(H)  Total Bilirubin 0.3 - 1.2 mg/dL - - 1.4(H)  Alkaline Phos 38 - 126 U/L - - 57  AST 15 - 41 U/L - - 24  ALT 0 - 44 U/L - - 33    Imaging studies: No new pertinent imaging studies   Assessment/Plan:  34 y.o.malewith perforated radiculitis7Day Post-Ops/p robotic assisted  laparoscopic sigmoid colectomy with creation of end colostomy.  Patient recovering adequately.  Today having more stool in the colostomy bag.  We will repeat labs tomorrow.  We will continue IV antibiotic therapy.  I encouraged the patient to ambulate.  2.9(J, MD

## 2020-11-12 LAB — CBC
HCT: 38.9 % — ABNORMAL LOW (ref 39.0–52.0)
Hemoglobin: 12.7 g/dL — ABNORMAL LOW (ref 13.0–17.0)
MCH: 30.6 pg (ref 26.0–34.0)
MCHC: 32.6 g/dL (ref 30.0–36.0)
MCV: 93.7 fL (ref 80.0–100.0)
Platelets: 291 10*3/uL (ref 150–400)
RBC: 4.15 MIL/uL — ABNORMAL LOW (ref 4.22–5.81)
RDW: 11.9 % (ref 11.5–15.5)
WBC: 14.7 10*3/uL — ABNORMAL HIGH (ref 4.0–10.5)
nRBC: 0 % (ref 0.0–0.2)

## 2020-11-12 NOTE — Consult Note (Signed)
WOC Nurse ostomy follow up Patient and wife report no questions today and feeling more confident in ostomy management.  Will practice emptying pouch today until independent.  WOC Nurse will see Monday if patient is still in house and assist wife with another pouch change.  WOC nursing team will follow, and will remain available to this patient, the nursing and medical teams.  Thanks, Ladona Mow, MSN, RN, GNP, [place]  Pager# [phone]

## 2020-11-12 NOTE — Progress Notes (Signed)
SURGICAL PROGRESS NOTE   Hospital Day(s): 8.   Post op day(s): 8 Days Post-Op.   Interval History: Patient seen and examined, no acute events or new complaints overnight. Patient reports feeling better. Reports pain improving. No nausea or vomiting. Continue having bowel movement.   Vital signs in last 24 hours: [min-max] current  Temp:  [97.6 F (36.4 C)-98.9 F (37.2 C)] 98.5 F (36.9 C) (02/18 1047) Pulse Rate:  [75-90] 90 (02/18 1047) Resp:  [16-20] 18 (02/18 1047) BP: (114-120)/(65-72) 118/70 (02/18 1047) SpO2:  [94 %-97 %] 94 % (02/18 1047)     Height: 6\' 4"  (193 cm) Weight: 117.9 kg BMI (Calculated): 31.66   Physical Exam:  Constitutional: alert, cooperative and no distress  Respiratory: breathing non-labored at rest  Cardiovascular: regular rate and sinus rhythm  Gastrointestinal: soft, non-tender, and non-distended. Colostomy pink and patent.   Labs:  CBC Latest Ref Rng & Units 11/12/2020 11/10/2020 11/07/2020  WBC 4.0 - 10.5 K/uL 14.7(H) 23.3(H) 15.9(H)  Hemoglobin 13.0 - 17.0 g/dL 12.7(L) 13.5 13.9  Hematocrit 39.0 - 52.0 % 38.9(L) 39.2 41.3  Platelets 150 - 400 K/uL 291 261 247   CMP Latest Ref Rng & Units 11/07/2020 11/05/2020 11/04/2020  Glucose 70 - 99 mg/dL 01/02/2021) 128(N) 867(E)  BUN 6 - 20 mg/dL 19 14 13   Creatinine 0.61 - 1.24 mg/dL 720(N 4.70  Sodium 135 - 145 mmol/L 135 136 136  Potassium 3.5 - 5.1 mmol/L 3.8 3.9 3.9  Chloride 98 - 111 mmol/L 96(L) 101 99  CO2 22 - 32 mmol/L 28 24 25   Calcium 8.9 - 10.3 mg/dL 9.62) 8.36) 9.2  Total Protein 6.5 - 8.1 g/dL - - 8.6(H)  Total Bilirubin 0.3 - 1.2 mg/dL - - 1.4(H)  Alkaline Phos 38 - 126 U/L - - 57  AST 15 - 41 U/L - - 24  ALT 0 - 44 U/L - - 33    Imaging studies: No new pertinent imaging studies   Assessment/Plan:  35 y.o.malewith perforated radiculitis8Day Post-Ops/p robotic assisted laparoscopic sigmoid colectomy with creation of end colostomy.  Patient recovering adequately. Will advance  diet to soft diet. Will continue with IV abx therapy. Will encourage patient to ambulate. If he tolerates diet tomorrow, may consider discharge home.   4.7(M, MD

## 2020-11-13 ENCOUNTER — Encounter: Payer: Self-pay | Admitting: General Surgery

## 2020-11-13 ENCOUNTER — Inpatient Hospital Stay: Payer: BC Managed Care – PPO

## 2020-11-13 LAB — AEROBIC/ANAEROBIC CULTURE W GRAM STAIN (SURGICAL/DEEP WOUND)

## 2020-11-13 MED ORDER — OXYCODONE-ACETAMINOPHEN 7.5-325 MG PO TABS: 1.0000 | ORAL_TABLET | Freq: Four times a day (QID) | ORAL | 0 refills | Status: DC | PRN

## 2020-11-13 MED ORDER — SODIUM CHLORIDE 0.9% FLUSH: 5.0000 mL | Freq: Every day | INTRAVENOUS | 2 refills | Status: DC

## 2020-11-13 MED ORDER — AMOXICILLIN-POT CLAVULANATE 875-125 MG PO TABS: 1.0000 | ORAL_TABLET | Freq: Two times a day (BID) | ORAL | 0 refills | Status: AC

## 2020-11-13 MED ORDER — IOHEXOL 300 MG/ML  SOLN
125.0000 mL | Freq: Once | INTRAMUSCULAR | Status: AC | PRN
  Administered 2020-11-13: 125 mL via INTRAVENOUS

## 2020-11-13 NOTE — Discharge Instructions (Signed)

## 2020-11-13 NOTE — Progress Notes (Signed)
Discharge order received. Patient mental status is at baseline. Vital signs stable . No signs of acute distress. Discharge instructions given. Patient verbalized understanding. No other issues noted at this time.   

## 2020-11-13 NOTE — Progress Notes (Signed)
If patient is to be discharged, below are discharge instructions: - Flush drain once daily with 5-10 cc NS flush (patient will need an order for flushes upon discharge). RN aware to teach patient how to manage drains at home. - Record output from drain once daily. - Follow-up at drain clinic 10-14 days after discharge for CT/possible drain injection (assess for possible drain removal)- IR schedulers to call patient to set up this appointment.   Please call IR with any questions.  Alwyn Ren, [place] [phone] 11/13/2020, 12:02 PM

## 2020-11-13 NOTE — TOC Transition Note (Signed)
Transition of Care [facility]) - CM/SW Discharge Note   Patient Details  Name: Emari Demmer MRN: [id number] Date of Birth: May 15, 1986  [facility] CM/SW Contact:  Bing Quarry, RN Phone Number: 11/13/2020, 2:39 PM   Clinical Narrative:   Pt declined HH during discussion and feels he and his wife can handle ostomy care, JP drain until f/u appointment. Reviewed needs until then and he still feels they can handle it and declined HH at this time. Communicated this to Unit RN. Patient is discharged home/self care. Gabriel Cirri RN CM    Final next level of care: Home/Self Care Barriers to Discharge: Barriers Resolved   Patient Goals and CMS Choice        Discharge Placement                  Name of family member notified:  (Wife present with patient.)    Discharge Plan and Services                DME Arranged: N/A DME Agency: NA       HH Arranged:  (Discussed with patient for potential orders, but declined HH RN) HH Agency: NA        Social Determinants of Health (SDOH) Interventions     Readmission Risk Interventions No flowsheet data found.

## 2020-11-13 NOTE — Discharge Summary (Signed)
  Patient ID: Mark [facility] MRN: [id number] DOB/AGE: 1986/05/23 35 y.o.  Admit date: 11/04/2020 Discharge date: 11/13/2020   Discharge Diagnoses:  Active Problems:   Diverticulitis of colon with perforation   Procedures: Robotic assisted laparoscopic sigmoid colectomy with end colostomy creation                       CT-guided percutaneous drainage of intra-abdominal abscess  Hospital Course: Patient with perforated diverticulitis. He underwent robotic assisted laparoscopic sigmoid colectomy with end colostomy. He was recovering adequately. He developed ileus. CT scan showed intra-abdominal abscess. He was drained percutaneously. After drainage of intraluminal abscess he started to have bowel movements. He is tolerating soft diet. He is ambulating. Pain controlled.  Physical Exam Cardiovascular:     Rate and Rhythm: Normal rate and regular rhythm.  Pulmonary:     Effort: Pulmonary effort is normal.  Abdominal:     General: Abdomen is flat.  Neurological:     Mental Status: He is alert and oriented to person, place, and time.   Wounds are dry and clean   Consults: Interventional radiology  Disposition: Discharge disposition: 01-Home or Self Care        Allergies as of 11/13/2020   No Known Allergies     Medication List    TAKE these medications   amoxicillin-clavulanate 875-125 MG tablet Commonly known as: Augmentin Take 1 tablet by mouth 2 (two) times daily for 10 days.   oxyCODONE-acetaminophen 7.5-325 MG tablet Commonly known as: PERCOCET Take 1 tablet by mouth every 6 (six) hours as needed for moderate pain.   sodium chloride flush 0.9 % Soln Commonly known as: NS 5 mLs by Intracatheter route daily.       Follow-up Information    [facility] Follow up.   Why: Follow up in our office in 10-14 days after hospital discharge. A scheduler from our office will call you to arrange this appointment. Please call our office with any  questions/concerns prior to your visit.  Contact information: [address] [place] [address] [phone]        Carolan Shiver, MD Follow up on 11/18/2020.   Specialty: General Surgery Contact information: [address] [place] [address] [phone]

## 2020-11-13 NOTE — TOC Progression Note (Signed)
Transition of Care [facility]) - Progression Note    Patient Details  Name: Mark Spencer MRN: [id number] Date of Birth: 05-04-1986  Transition of Care [facility]) CM/SW Contact  Bing Quarry, RN Phone Number: 11/13/2020, 2:29 PM  Clinical Narrative:    Patient to be discharged today. Wound Care RN has educated patient and wife on ostomy care and patient states they have also been educated on how to perform the JP flushes. Was exploring [facility] agencies on the potential need for RN. Discussed with patient and he declined need to [facility] RN as he "has a follow up on Thursday and we can handle it until then". Communicated this to Unit RN and for any additional pre-discharge teaching needed. Provider in in OR. Did not order HH on discharge. Gabriel Cirri RN CM    Expected Discharge Plan: Home/Self Care Barriers to Discharge: Continued Medical Work up  Expected Discharge Plan and Services Expected Discharge Plan: Home/Self Care         Expected Discharge Date: 11/13/20                                     Social Determinants of Health (SDOH) Interventions    Readmission Risk Interventions No flowsheet data found.

## 2020-11-15 ENCOUNTER — Other Ambulatory Visit: Payer: Self-pay | Admitting: General Surgery

## 2020-11-15 DIAGNOSIS — K5720 Diverticulitis of large intestine with perforation and abscess without bleeding: Secondary | ICD-10-CM

## 2020-11-20 ENCOUNTER — Telehealth: Payer: Self-pay | Admitting: General Surgery

## 2020-11-20 ENCOUNTER — Other Ambulatory Visit: Payer: Self-pay | Admitting: General Surgery

## 2020-11-20 MED ORDER — OXYCODONE HCL 5 MG PO TABS: 5.0000 mg | ORAL_TABLET | Freq: Four times a day (QID) | ORAL | 0 refills | Status: DC | PRN

## 2020-11-20 NOTE — Telephone Encounter (Signed)
I received a phone call from the answering service for Dr. Maia Plan.  I contacted the patient's wife as requested.  Apparently, the pain medication that was prescribed is requiring preauthorization from Dr. Maia Plan and the patient is unable to obtain this.  The current prescription is for Percocet 7.5/325 mg tablets.  In addition, the pharmacy is reporting to the patient's wife that they do not have saline flushes at all.  I offered to attempt a different pain medication prescription, thinking that perhaps it was the 7.5 mg dose that was requiring preauthorization, I sent in a new prescription for oxycodone 5 mg every 6 hours as needed moderate to severe pain.  I did not have a good solution for the lack of saline flushes, however.  I let the patient's wife know that I would communicate with Dr. Maia Plan regarding these challenges.

## 2020-11-22 ENCOUNTER — Other Ambulatory Visit: Payer: Self-pay | Admitting: General Surgery

## 2020-11-23 ENCOUNTER — Ambulatory Visit
Admission: RE | Admit: 2020-11-23 | Discharge: 2020-11-23 | Disposition: A | Discharge status: Home / Self Care | Payer: BC Managed Care – PPO | Source: Ambulatory Visit | Attending: General Surgery | Admitting: General Surgery

## 2020-11-23 ENCOUNTER — Encounter: Payer: Self-pay | Admitting: Radiology

## 2020-11-23 ENCOUNTER — Ambulatory Visit
Admission: RE | Admit: 2020-11-23 | Discharge: 2020-11-23 | Disposition: A | Discharge status: Home / Self Care | Payer: BC Managed Care – PPO | Source: Ambulatory Visit | Attending: Student | Admitting: Student

## 2020-11-23 DIAGNOSIS — K5720 Diverticulitis of large intestine with perforation and abscess without bleeding: Secondary | ICD-10-CM

## 2020-11-23 HISTORY — PX: IR RADIOLOGIST EVAL & MGMT: IMG5224

## 2020-11-23 MED ORDER — IOPAMIDOL (ISOVUE-300) INJECTION 61%
100.0000 mL | Freq: Once | INTRAVENOUS | Status: AC | PRN
  Administered 2020-11-23: 100 mL via INTRAVENOUS

## 2020-11-23 NOTE — Progress Notes (Signed)
Referring Physician(s): Carolan Shiver  Chief Complaint: The patient is seen in follow up today s/p pelvic fluid collection drain placement 11/10/20  History of present illness:  Mark Spencer is a 35 y.o. male with a no significant past medical history. He presented to the [facility] ED 11/04/20 with worsening abdominal pain and CT imaging showed a perforated sigmoid diverticulitis. He was taken to the OR 11/05/20 for a robotic-assisted laparoscopic sigmoid colectomy with creation of end colostomy. Repeat CT imaging demonstrated an intra-abdominal fluid collection and Interventional Radiology was consulted for drain placement; this was placed 11/10/20. He was discharged home 11/20/20.  The patient presents to the outpatient clinic today for drain evaluation including CT scan and possible drain injection. He remains on oral antibiotics and has been afebrile. He has been flushing the drain twice daily with 5 ml NS and has a daily output of approximately 15-30 ml. He endorses a good appetite and good energy levels. He has intermittent abdominal discomfort throughout the day and he takes pain medication every 6 hours.    No past medical history on file.  No past surgical history on file.  Allergies: Patient has no known allergies.  Medications: Prior to Admission medications   Medication Sig Start Date End Date Taking? Authorizing Provider  amoxicillin-clavulanate (AUGMENTIN) 875-125 MG tablet Take 1 tablet by mouth 2 (two) times daily for 10 days. 11/13/20 11/23/20  Carolan Shiver, MD  oxyCODONE (ROXICODONE) 5 MG immediate release tablet Take 1 tablet (5 mg total) by mouth every 6 (six) hours as needed for moderate pain or severe pain. 11/20/20 11/20/21  Duanne Guess, MD  sodium chloride flush (NS) 0.9 % SOLN 5 mLs by Intracatheter route daily. 11/13/20   Carolan Shiver, MD     No family history on file.  Social History   Socioeconomic History  . Marital status: Unknown     Spouse name: Not on file  . Number of children: Not on file  . Years of education: Not on file  . Highest education level: Not on file  Occupational History  . Not on file  Tobacco Use  . Smoking status: Never Smoker  . Smokeless tobacco: Never Used  Substance and Sexual Activity  . Alcohol use: Yes  . Drug use: Never  . Sexual activity: Not on file  Other Topics Concern  . Not on file  Social History Narrative  . Not on file   Social Determinants of Health   Financial Resource Strain: Not on file  Food Insecurity: Not on file  Transportation Needs: Not on file  Physical Activity: Not on file  Stress: Not on file  Social Connections: Not on file     Vital Signs: Temp: 98.6, BP 118/71, HR 90, Oxygen saturation 96% on room air  Physical Exam Constitutional:      General: He is not in acute distress.    Appearance: He is not ill-appearing.  Abdominal:     Palpations: Abdomen is soft.     Tenderness: There is abdominal tenderness.     Comments: Lower right/mid abdominal drain to suction. Approximately 20 ml of thin, purulent fluid in bulb. Drain easily flushed. Site is clean and dry.   Skin:    General: Skin is warm and dry.  Neurological:     Mental Status: He is alert and oriented to person, place, and time.     Imaging: No results found.  Labs:  CBC: Recent Labs    11/05/20 0430 11/07/20 0525 11/10/20 0542 11/12/20  0936  WBC 22.7* 15.9* 23.3* 14.7*  HGB 13.8 13.9 13.5 12.7*  HCT 39.7 41.3 39.2 38.9*  PLT 185 247 261 291    COAGS: Recent Labs    11/10/20 0542  INR 1.4*    BMP: Recent Labs    11/04/20 1530 11/05/20 0430 11/07/20 0525  NA 136 136 135  K 3.9 3.9 3.8  CL 99 101 96*  CO2 25 24 28   GLUCOSE 118* 171* 111*  BUN 13 14 19   CALCIUM 9.2 8.4* 8.4*  CREATININE 0.91 0.93 0.98  GFRNONAA >60 >60 >60    LIVER FUNCTION TESTS: Recent Labs    11/04/20 1530  BILITOT 1.4*  AST 24  ALT 33  ALKPHOS 57  PROT 8.6*  ALBUMIN 3.9     Assessment:  Perforated colonic diverticulitis s/p colostomy; development of intra-abdominal abscess s/p drain placement in IR 11/10/20  CT imaging today showed no residual fluid collection but contrast injection under fluoroscopy did demonstrate a fistulous connection to the bowel. Given this finding along with a moderate amount of daily output the drain will remain in place. The drain was switched to a gravity bag and the patient was instructed to gently flush the drain once daily with 3-5 ml of NS. He will return to the clinic in 3 weeks for drain injection only. He has a follow up appointment with Dr. 01/02/21 next Tuesday and he was encouraged to keep this appointment. He knows he can call our clinic with any drain-related questions or concerns.   Signed: Trisha Mangle, NP 11/23/2020, 2:27 PM   Please refer to Dr. Mickie Kay attestation of this note for management and plan.

## 2020-11-24 ENCOUNTER — Other Ambulatory Visit: Payer: Self-pay | Admitting: General Surgery

## 2020-11-24 DIAGNOSIS — K5720 Diverticulitis of large intestine with perforation and abscess without bleeding: Secondary | ICD-10-CM

## 2020-11-25 ENCOUNTER — Other Ambulatory Visit: Payer: Self-pay | Admitting: General Surgery

## 2020-11-25 NOTE — Telephone Encounter (Signed)
Taking Oxycodone every 8-10 hours for the past few days. He has tried taking Tylenol in between doses. He is not taking Ibuprofen due to upset stomach- general  discomfort on average 3-4. He is trying to help with his children and his wife [place] and sometimes he feels he does to much and has some discomfort.

## 2020-11-30 ENCOUNTER — Other Ambulatory Visit: Payer: Self-pay | Admitting: General Surgery

## 2020-11-30 DIAGNOSIS — K5720 Diverticulitis of large intestine with perforation and abscess without bleeding: Secondary | ICD-10-CM

## 2020-12-03 ENCOUNTER — Other Ambulatory Visit: Payer: Self-pay | Admitting: General Surgery

## 2020-12-10 ENCOUNTER — Other Ambulatory Visit: Payer: Self-pay | Admitting: General Surgery

## 2020-12-14 ENCOUNTER — Ambulatory Visit
Admission: RE | Admit: 2020-12-14 | Discharge: 2020-12-14 | Disposition: A | Discharge status: Home / Self Care | Payer: BC Managed Care – PPO | Source: Ambulatory Visit | Attending: General Surgery | Admitting: General Surgery

## 2020-12-14 ENCOUNTER — Encounter: Payer: Self-pay | Admitting: Radiology

## 2020-12-14 DIAGNOSIS — K5720 Diverticulitis of large intestine with perforation and abscess without bleeding: Secondary | ICD-10-CM

## 2020-12-14 HISTORY — PX: IR RADIOLOGIST EVAL & MGMT: IMG5224

## 2020-12-14 IMAGING — RF DG SINUS / FISTULA TRACT / ABSCESSOGRAM
4 series · 12 of 12 positions shown · non-contrast
Comparison: none

INDICATION: 34-year-old male with a history of prior diverticulitis, treated
surgically with partial left colectomy, colonic diversion/Hartmann's
pouch

[Series 1: sequence · 4 of 70 frames shown (1 of 3)]
[frame 2/70]
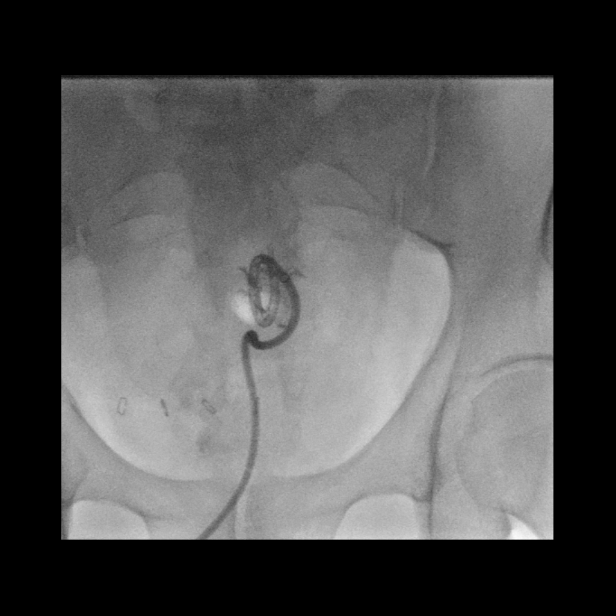
[frame 11/70]
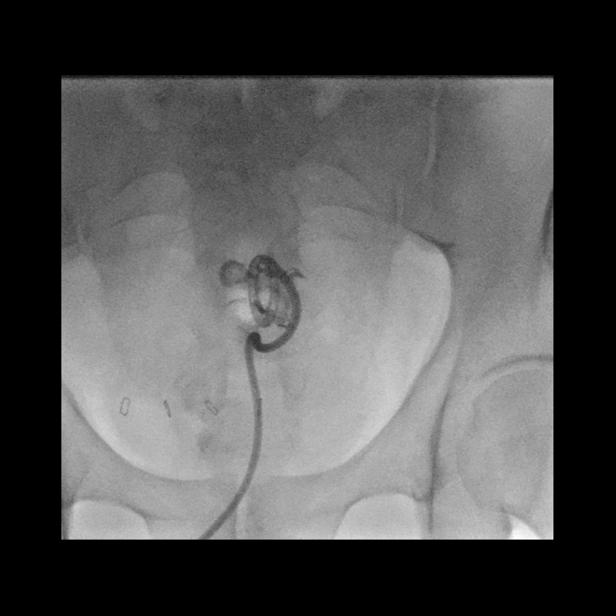
[frame 36/70]
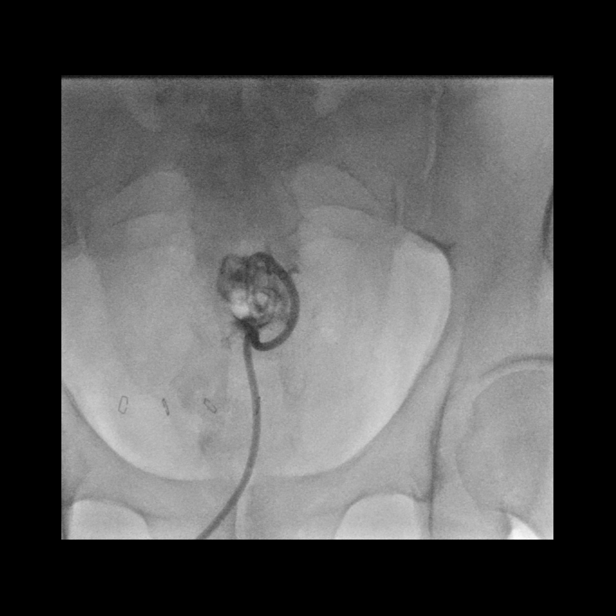
[frame 60/70]
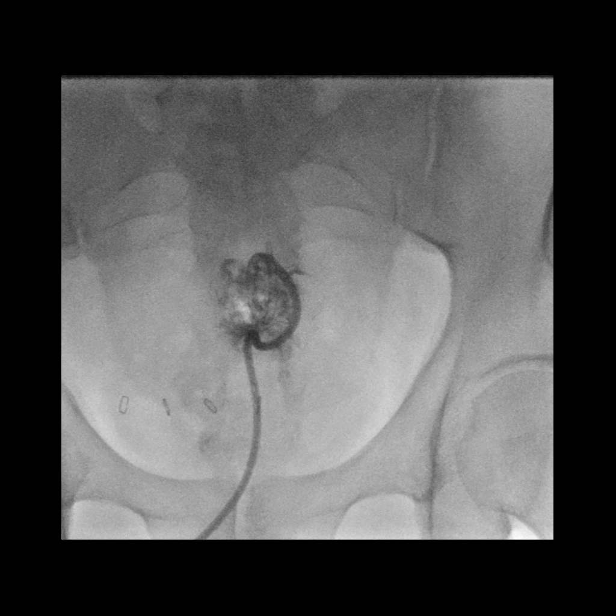

[Series 2: sequence · 3 of 4 frames shown (2 of 3)]
[frame 1/4]
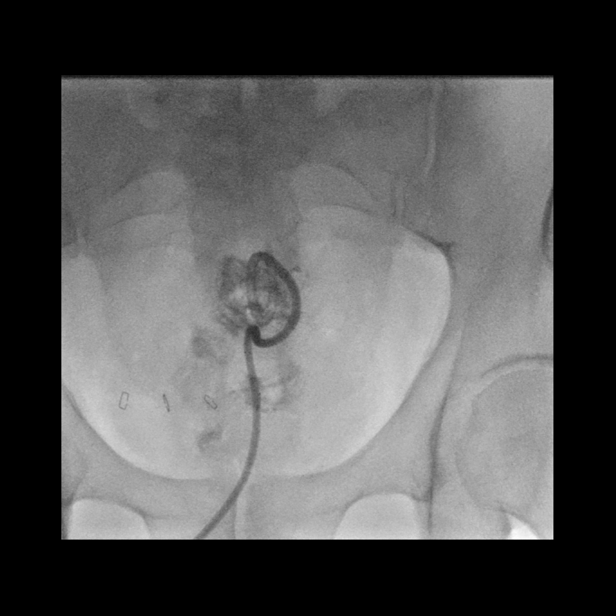
[frame 3/4]
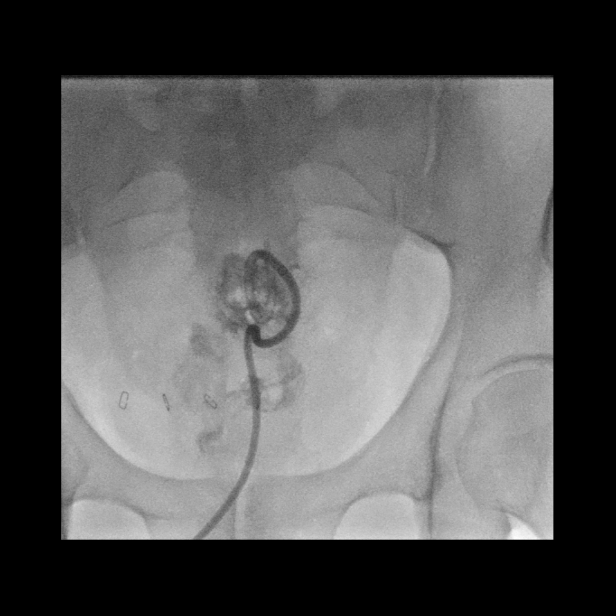
[frame 4/4]
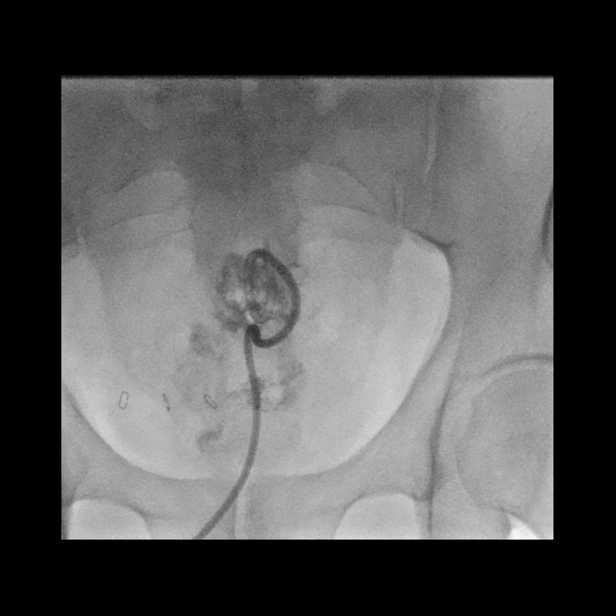

[Series 3: sequence · 3 of 3 frames shown (3 of 3)]
[frame 1/3]
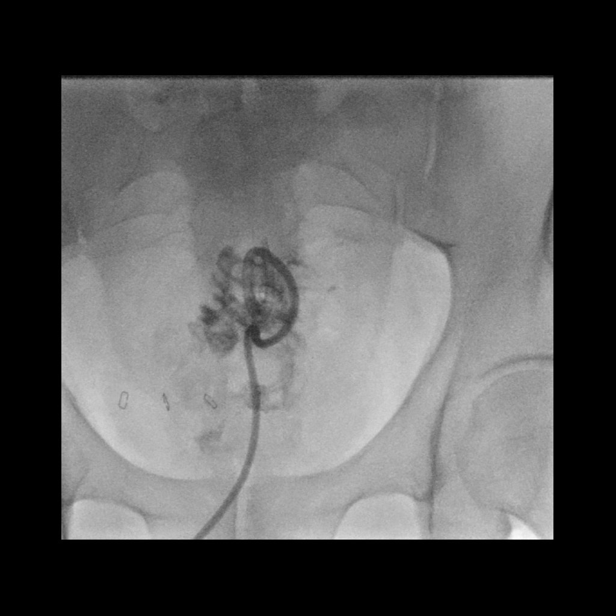
[frame 2/3]
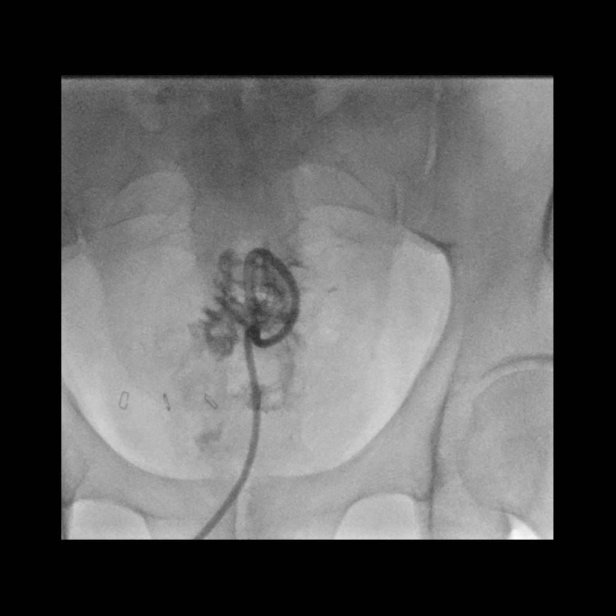
[frame 3/3]
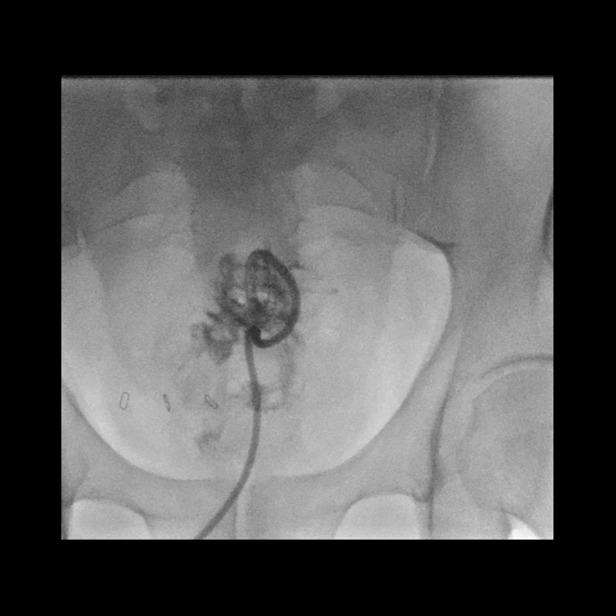

[Series 4: one shot · 2 of 2 slices shown]
[im 1/2]
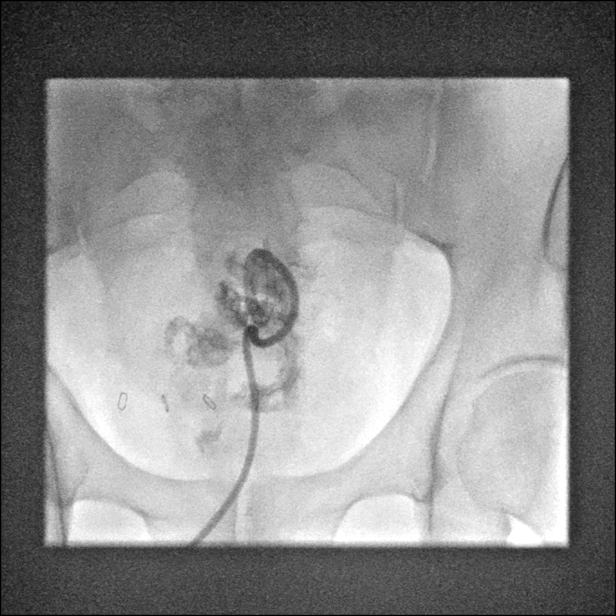
[im 2/2]
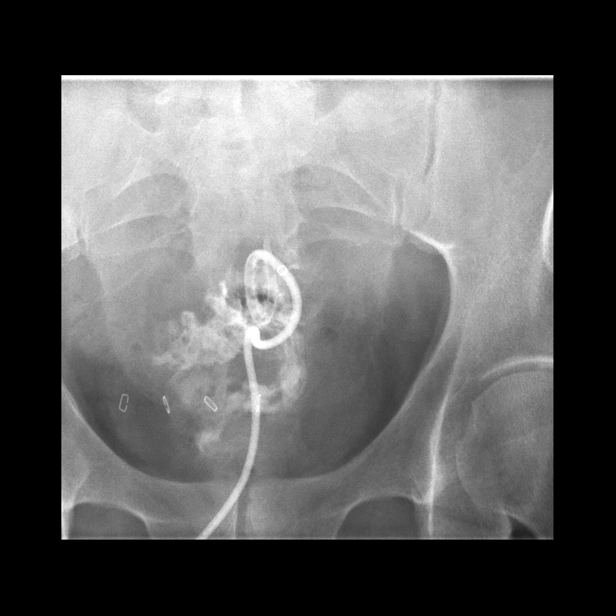

[12 of 12 positions shown; findings below may reference images not displayed]

EXAM:
IMAGE GUIDED DRAIN INJECTION

MEDICATIONS:
NONE

ANESTHESIA/SEDATION:
NONE

COMPLICATIONS:
NONE

PROCEDURE:
Informed written consent was obtained from the patient after a
thorough discussion of the procedural risks, benefits and
alternatives. All questions were addressed. Maximal Sterile Barrier
Technique was utilized including caps, mask, sterile gowns, sterile
gloves, sterile drape, hand hygiene and skin antiseptic. A timeout
was performed prior to the initiation of the procedure.

Patient was positioned supine position on the fluoroscopy table.
Scout images were acquired.

Gentle drain injection was performed with contrast.

There is no residual abscess cavity identified.

There is a residual connection to the rectum, at the most superior
aspect of the surgical site.

At the conclusion the drain was gently flushed.

After review of the images and discussion with the patient the drain
was removed
IMPRESSION: Drain injection reveals no residual abscess at the site of the
pigtail drainage catheter, with a persisting fistulous connection to
the APARTMAN BUDVA at the surgical site. After review and
discussion with the patient, the drain was removed to allow healing
at the surgical site.

## 2020-12-14 NOTE — Progress Notes (Signed)
Referring Physician(s): Cintron-Diaz,Edgardo  Chief Complaint: The patient is seen in follow up today s/p Lower abdominal -pelvic abscess drain placement on 2.16.22  History of present illness: 35 y.o. male outpatient. History of/ presented to the ED at [facility] with persistent and worsening abdominal pain.  Found to have perforated diverticulitis s/p robotic sigmoid colectomy and creation of end colostomy on 2.10.22. Mark Spencer was found to have a post surgical lower abdominal - pelvic abscess. IR placed an abscess drain on 2.16.22. Abscessogram performed on 3.1.22 showed a resolved abscess cavity with a fistula.   Mark Spencer  is reporting scant output. The drain is to a gravity bag and he is flushing the draw 5 ml daily with 2-3 ml of fluid aspirated. Output is tab in nature. He denies any diarhea or fevers. He reports intermittent generalized abdominal pain that he states is related to his increase in activity. Pain is resolved with prescription pain medication  That he takes PRN. He has had one episode of nausea and vomiting which he attributes to having [employer] pizza. He states over all he is feeling better and states that he is scheduled to return to work next week.   No past medical history on file.  Past Surgical History:  Procedure Laterality Date  . IR RADIOLOGIST EVAL & MGMT  11/23/2020    Allergies: Patient has no known allergies.  Medications: Prior to Admission medications   Medication Sig Start Date End Date Taking? Authorizing Provider  oxyCODONE (ROXICODONE) 5 MG immediate release tablet Take 1 tablet (5 mg total) by mouth every 6 (six) hours as needed for moderate pain or severe pain. 11/20/20 11/20/21  Duanne Guess, MD  sodium chloride flush (NS) 0.9 % SOLN 5 mLs by Intracatheter route daily. 11/13/20   Carolan Shiver, MD     No family history on file.  Social History   Socioeconomic History  . Marital status: Unknown    Spouse name: Not on file  . Number of  children: Not on file  . Years of education: Not on file  . Highest education level: Not on file  Occupational History  . Not on file  Tobacco Use  . Smoking status: Never Smoker  . Smokeless tobacco: Never Used  Substance and Sexual Activity  . Alcohol use: Yes  . Drug use: Never  . Sexual activity: Not on file  Other Topics Concern  . Not on file  Social History Narrative  . Not on file   Social Determinants of Health   Financial Resource Strain: Not on file  Food Insecurity: Not on file  Transportation Needs: Not on file  Physical Activity: Not on file  Stress: Not on file  Social Connections: Not on file     Vital Signs: There were no vitals taken for this visit.  Physical Exam Vitals and nursing note reviewed.  Constitutional:      Appearance: He is well-developed.  HENT:     Head: Normocephalic.  Pulmonary:     Effort: Pulmonary effort is normal.  Abdominal:     Comments: Positive lower midline pevlic abscess drain to gravity bag. Site is unremarkable with no erythema, edema, tenderness, bleeding or drainage noted at exit site. Suture and stat lock in place.  Scant tan colored fluid noted in gravity bag.   Musculoskeletal:        General: Normal range of motion.     Cervical back: Normal range of motion.  Skin:    General: Skin is dry.  Neurological:     Mental Status: He is alert and oriented to person, place, and time.     Imaging: No results found.  Labs:  CBC: Recent Labs    11/05/20 0430 11/07/20 0525 11/10/20 0542 11/12/20 0936  WBC 22.7* 15.9* 23.3* 14.7*  HGB 13.8 13.9 13.5 12.7*  HCT 39.7 41.3 39.2 38.9*  PLT 185 247 261 291    COAGS: Recent Labs    11/10/20 0542  INR 1.4*    BMP: Recent Labs    11/04/20 1530 11/05/20 0430 11/07/20 0525  NA 136 136 135  K 3.9 3.9 3.8  CL 99 101 96*  CO2 25 24 28   GLUCOSE 118* 171* 111*  BUN 13 14 19   CALCIUM 9.2 8.4* 8.4*  CREATININE 0.91 0.93 0.98  GFRNONAA >60 >60 >60     LIVER FUNCTION TESTS: Recent Labs    11/04/20 1530  BILITOT 1.4*  AST 24  ALT 33  ALKPHOS 57  PROT 8.6*  ALBUMIN 3.9    Assessment:  35 y.o. male outpatient. History of/ presented to the ED at [facility] with persistent and worsening abdominal pain.  Found to have perforated diverticulitis s/p robotic sigmoid colectomy and creation of end colostomy on 2.10.22. Mark Spencer was found to have a post surgical lower abdominal - pelvic abscess. IR placed an abscess drain on 2.16.22. Abscessogram performed on 3.1.22 showed a resolved abscess cavity with a fistula. Mark Spencer is being followed by Dr. 5.1.22Alvester Morin in Surgery.  Mark Spencer presents today for a drain follow up. He has completed his course antibiotics and denies any fevers in the last week. Flushing daily 5 ml and aspirating 2-3 ml with scant output that is tan nature noted to be int he gravity bag. Abscess drain to gravity bag. Site is unremarkable with no erythema, edema, tenderness, bleeding or drainage noted at exit site. Suture and stat lock in place.    Follow-up drain injection today reveals resolution of prior abscess with small residual collection noted to be the sealed surgical stump. Images were reviewed by Dr. Trisha Mangle who recommends drain removal at this time. drain removed intact, no complications,  patient tolerated procedure well, dressing applied to exit site.   Post-removal instructions: 1- Okay  to shower/sponge bath 24 hours post-removal. 2- No submerging (swimming, bathing) for 7 days post-removal. 3- Change dressing PRN until site fully healed.   Please see attestation from Dr. Alvester Morin regarding findings and care plan.   Signed: Loreta Ave, NP 12/14/2020, 1:36 PM   Please refer to Dr. Alene Mires attestation of this note for management and plan.

## 2020-12-21 ENCOUNTER — Other Ambulatory Visit: Payer: BC Managed Care – PPO

## 2021-01-17 ENCOUNTER — Other Ambulatory Visit (HOSPITAL_COMMUNITY): Payer: Self-pay | Admitting: General Surgery

## 2021-01-17 ENCOUNTER — Other Ambulatory Visit: Payer: Self-pay | Admitting: General Surgery

## 2021-01-17 DIAGNOSIS — K651 Peritoneal abscess: Secondary | ICD-10-CM

## 2021-01-24 ENCOUNTER — Ambulatory Visit: Admission: RE | Admit: 2021-01-24 | Payer: BC Managed Care – PPO | Source: Ambulatory Visit

## 2021-06-02 ENCOUNTER — Other Ambulatory Visit
Admission: RE | Admit: 2021-06-02 | Discharge: 2021-06-02 | Disposition: A | Discharge status: Home / Self Care | Payer: BC Managed Care – PPO | Source: Ambulatory Visit | Attending: General Surgery | Admitting: General Surgery

## 2021-06-02 ENCOUNTER — Other Ambulatory Visit: Payer: Self-pay

## 2021-06-02 ENCOUNTER — Ambulatory Visit: Payer: Self-pay | Admitting: General Surgery

## 2021-06-02 NOTE — H&P (View-Only) (Signed)
HISTORY OF PRESENT ILLNESS:    [name] is a 35 y.o.male patient who comes for preoperative evaluation of colostomy takedown.  Patient with history of acute diverticulitis with perforation of the sigmoid colon.  He underwent robotic assisted laparoscopic Hartman's procedure.  He developed intra-abdominal abscess that was drained percutaneously.  He eventually recovered adequately.  Patient had colonoscopy last week without any pathology identified.  Patient has been taking adequate care of the colostomy.  Having regular bowel movement.  Tolerating diet.  He denies any pain.  No pain radiation.  No alleviating or aggravating factors.      PAST MEDICAL HISTORY:  History reviewed. No pertinent past medical history.      PAST SURGICAL HISTORY:   Past Surgical History:  Procedure Laterality Date   ADENOIDECTOMY     COLON SURGERY  11/04/2020   Dr Mark Spencer -- ROBOTIC sigmoid colectomy   COLONOSCOPY  05/31/2021   Normal colon/Repeat 69yrs/Mark Spencer         MEDICATIONS:  Outpatient Encounter Medications as of 06/02/2021  Medication Sig Dispense Refill   pantoprazole (PROTONIX) 40 MG DR tablet TAKE 1 TABLET (40 MG TOTAL) BY MOUTH ONCE DAILY TAKE 15-20 MINS BEFORE MEAL (Patient not taking: No sig reported) 90 tablet 2   No facility-administered encounter medications on file as of 06/02/2021.     ALLERGIES:   Patient has no known allergies.   SOCIAL HISTORY:  Social History   Socioeconomic History   Marital status: Married  Tobacco Use   Smoking status: Former Smoker    Quit date: 03/25/2013    Years since quitting: 8.1   Smokeless tobacco: Former Neurosurgeon    Quit date: 02/23/2006  Vaping Use   Vaping Use: Never used  Substance and Sexual Activity   Alcohol use: Yes    Comment: glass of wine occasionally   Drug use: Never    FAMILY HISTORY:  Family History  Problem Relation Age of Onset   Obesity Mother    Heart disease Father      GENERAL REVIEW OF SYSTEMS:   General ROS:  negative for - chills, fatigue, fever, weight gain or weight loss Allergy and Immunology ROS: negative for - hives  Hematological and Lymphatic ROS: negative for - bleeding problems or bruising, negative for palpable nodes Endocrine ROS: negative for - heat or cold intolerance, hair changes Respiratory ROS: negative for - cough, shortness of breath or wheezing Cardiovascular ROS: no chest pain or palpitations GI ROS: negative for nausea, vomiting, abdominal pain, diarrhea, constipation Musculoskeletal ROS: negative for - joint swelling or muscle pain Neurological ROS: negative for - confusion, syncope Dermatological ROS: negative for pruritus and rash  PHYSICAL EXAM:  Vitals:   06/02/21 0944  BP: 121/83  Pulse: (!) 122  .  Ht:193 cm (6\' 4" ) Wt:(!) 127.9 kg (282 lb) surface area is 2.62 meters squared. Body mass index is 34.33 kg/m.NTZ:GYFV   GENERAL: Alert, active, oriented x3  HEENT: Pupils equal reactive to light. Extraocular movements are intact. Sclera clear. Palpebral conjunctiva normal red color.Pharynx clear.  NECK: Supple with no palpable mass and no adenopathy.  LUNGS: Sound clear with no rales rhonchi or wheezes.  HEART: Regular rhythm S1 and S2 without murmur.  ABDOMEN: Soft and depressible, nontender with no palpable mass, no hepatomegaly.  Colostomy pink and patent in the left lower quadrant.  EXTREMITIES: Well-developed well-nourished symmetrical with no dependent edema.  NEUROLOGICAL: Awake alert oriented, facial expression symmetrical, moving all extremities.  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  He has been recovering adequately.  He is tolerating diet.  Colostomy working adequately.  We discussed about colostomy reversal.  We discussed about surgical alternative of robotic assisted laparoscopic colostomy reversal.  Patient oriented a possibility of needing  to convert to open surgery.  Patient also oriented about surgical risks that include anastomosis leak, bleeding, infection, injury to kidney, ureter, bladder and adjacent organs.  Patient endorses that he understood the plan, the risk and agreed to proceed.   PLAN:  Robotic assisted laparoscopic colostomy reversal (44626, [id number], [id number]) CBC, CMP Avoid taking aspirin 5 days before procedure Take antibiotics as prescribed the day before surgery Take bowel prep the day before surgery as instructed Contact us if you have any concern.   Patient verbalized understanding, all questions were answered, and were agreeable with the plan outlined above.   Roslyn Else Cintron-Diaz, MD  Electronically signed by Mark Kaneshiro Cintron-Diaz, MD 

## 2021-06-02 NOTE — H&P (Signed)
HISTORY OF PRESENT ILLNESS:    [name] is a 35 y.o.male patient who comes for preoperative evaluation of colostomy takedown.  Patient with history of acute diverticulitis with perforation of the sigmoid colon.  He underwent robotic assisted laparoscopic Hartman's procedure.  He developed intra-abdominal abscess that was drained percutaneously.  He eventually recovered adequately.  Patient had colonoscopy last week without any pathology identified.  Patient has been taking adequate care of the colostomy.  Having regular bowel movement.  Tolerating diet.  He denies any pain.  No pain radiation.  No alleviating or aggravating factors.      PAST MEDICAL HISTORY:  History reviewed. No pertinent past medical history.      PAST SURGICAL HISTORY:   Past Surgical History:  Procedure Laterality Date   ADENOIDECTOMY     COLON SURGERY  11/04/2020   Dr Mark Spencer -- ROBOTIC sigmoid colectomy   COLONOSCOPY  05/31/2021   Normal colon/Repeat 69yrs/Mark Spencer         MEDICATIONS:  Outpatient Encounter Medications as of 06/02/2021  Medication Sig Dispense Refill   pantoprazole (PROTONIX) 40 MG DR tablet TAKE 1 TABLET (40 MG TOTAL) BY MOUTH ONCE DAILY TAKE 15-20 MINS BEFORE MEAL (Patient not taking: No sig reported) 90 tablet 2   No facility-administered encounter medications on file as of 06/02/2021.     ALLERGIES:   Patient has no known allergies.   SOCIAL HISTORY:  Social History   Socioeconomic History   Marital status: Married  Tobacco Use   Smoking status: Former Smoker    Quit date: 03/25/2013    Years since quitting: 8.1   Smokeless tobacco: Former Neurosurgeon    Quit date: 02/23/2006  Vaping Use   Vaping Use: Never used  Substance and Sexual Activity   Alcohol use: Yes    Comment: glass of wine occasionally   Drug use: Never    FAMILY HISTORY:  Family History  Problem Relation Age of Onset   Obesity Mother    Heart disease Father      GENERAL REVIEW OF SYSTEMS:   General ROS:  negative for - chills, fatigue, fever, weight gain or weight loss Allergy and Immunology ROS: negative for - hives  Hematological and Lymphatic ROS: negative for - bleeding problems or bruising, negative for palpable nodes Endocrine ROS: negative for - heat or cold intolerance, hair changes Respiratory ROS: negative for - cough, shortness of breath or wheezing Cardiovascular ROS: no chest pain or palpitations GI ROS: negative for nausea, vomiting, abdominal pain, diarrhea, constipation Musculoskeletal ROS: negative for - joint swelling or muscle pain Neurological ROS: negative for - confusion, syncope Dermatological ROS: negative for pruritus and rash  PHYSICAL EXAM:  Vitals:   06/02/21 0944  BP: 121/83  Pulse: (!) 122  .  Ht:193 cm (6\' 4" ) Wt:(!) 127.9 kg (282 lb) surface area is 2.62 meters squared. Body mass index is 34.33 kg/m.NTZ:GYFV   GENERAL: Alert, active, oriented x3  HEENT: Pupils equal reactive to light. Extraocular movements are intact. Sclera clear. Palpebral conjunctiva normal red color.Pharynx clear.  NECK: Supple with no palpable mass and no adenopathy.  LUNGS: Sound clear with no rales rhonchi or wheezes.  HEART: Regular rhythm S1 and S2 without murmur.  ABDOMEN: Soft and depressible, nontender with no palpable mass, no hepatomegaly.  Colostomy pink and patent in the left lower quadrant.  EXTREMITIES: Well-developed well-nourished symmetrical with no dependent edema.  NEUROLOGICAL: Awake alert oriented, facial expression symmetrical, moving all extremities.  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  He has been recovering adequately.  He is tolerating diet.  Colostomy working adequately.  We discussed about colostomy reversal.  We discussed about surgical alternative of robotic assisted laparoscopic colostomy reversal.  Patient oriented a possibility of needing  to convert to open surgery.  Patient also oriented about surgical risks that include anastomosis leak, bleeding, infection, injury to kidney, ureter, bladder and adjacent organs.  Patient endorses that he understood the plan, the risk and agreed to proceed.   PLAN:  Robotic assisted laparoscopic colostomy reversal ([id number], [id number], [phone]) CBC, CMP Avoid taking aspirin 5 days before procedure Take antibiotics as prescribed the day before surgery Take bowel prep the day before surgery as instructed Contact us if you have any concern.   Patient verbalized understanding, all questions were answered, and were agreeable with the plan outlined above.   Mark Shiver, MD  Electronically signed by Mark Shiver, MD

## 2021-06-02 NOTE — Patient Instructions (Addendum)
Your procedure is scheduled on: 06/08/21  Report to the Registration Desk on the 1st floor of the [facility]. To find out your arrival time, please call [phone] between 1PM - 3PM on: 06/07/21  Report to [facility] for Labs and Covid Testing om 06/06/21 at 8:00 am.  REMEMBER: Instructions that are not followed completely may result in serious medical risk, up to and including death; or upon the discretion of your surgeon and anesthesiologist your surgery may need to be rescheduled.  TAKE THESE MEDICATIONS THE MORNING OF SURGERY WITH A SIP OF WATER:  as directed per Dr.CintronTrisha Mangle.  Follow instructions given to you by MD for your bowel prep the day before the surgery.  Take antibiotics the day before the surgery as instructed.   One week prior to surgery: Stop Anti-inflammatories (NSAIDS) such as Advil, Aleve, Ibuprofen, Motrin, Naproxen, Naprosyn and Aspirin based products such as Excedrin, Goodys Powder, BC Powder.  Stop ANY OVER THE COUNTER supplements until after surgery.  You may take Tylenol as directed if needed for pain up until the day of surgery.  No Alcohol for 24 hours before or after surgery.  No Smoking including e-cigarettes for 24 hours prior to surgery.  No chewable tobacco products for at least 6 hours prior to surgery.  No nicotine patches on the day of surgery.  Do not use any "recreational" drugs for at least a week prior to your surgery.  Please be advised that the combination of cocaine and anesthesia may have negative outcomes, up to and including death. If you test positive for cocaine, your surgery will be cancelled.  On the morning of surgery brush your teeth with toothpaste and water, you may rinse your mouth with mouthwash if you wish. Do not swallow any toothpaste or mouthwash.  Use CHG Soap or wipes as directed on instruction sheet.  Do not wear jewelry, make-up, hairpins, clips or nail polish.  Do not wear lotions, powders, or perfumes.    Do not shave body from the neck down 48 hours prior to surgery just in case you cut yourself which could leave a site for infection.  Also, freshly shaved skin may become irritated if using the CHG soap.  Contact lenses, hearing aids and dentures may not be worn into surgery.  Do not bring valuables to the hospital. [facility] is not responsible for any missing/lost belongings or valuables.   Notify your doctor if there is any change in your medical condition (cold, fever, infection).  Wear comfortable clothing (specific to your surgery type) to the hospital.  After surgery, you can help prevent lung complications by doing breathing exercises.  Take deep breaths and cough every 1-2 hours. Your doctor may order a device called an Incentive Spirometer to help you take deep breaths. When coughing or sneezing, hold a pillow firmly against your incision with both hands. This is called "splinting." Doing this helps protect your incision. It also decreases belly discomfort.  If you are being admitted to the hospital overnight, leave your suitcase in the car. After surgery it may be brought to your room.  If you are being discharged the day of surgery, you will not be allowed to drive home. You will need a responsible adult (18 years or older) to drive you home and stay with you that night.   If you are taking public transportation, you will need to have a responsible adult (18 years or older) with you. Please confirm with your physician that it  is acceptable to use public transportation.   Please call the [facility]. at [phone] if you have any questions about these instructions.  Surgery Visitation Policy:  Patients undergoing a surgery or procedure may have one family member or support person with them as long as that person is not COVID-19 positive or experiencing its symptoms.  That person may remain in the waiting area during the procedure.  Inpatient  Visitation:    Visiting hours are 7 a.m. to 8 p.m. Inpatients will be allowed two visitors daily. The visitors may change each day during the patient's stay. No visitors under the age of 34. Any visitor under the age of 36 must be accompanied by an adult. The visitor must pass COVID-19 screenings, use hand sanitizer when entering and exiting the patient's room and wear a mask at all times, including in the patient's room. Patients must also wear a mask when staff or their visitor are in the room. Masking is required regardless of vaccination status.

## 2021-06-06 ENCOUNTER — Encounter: Payer: Self-pay | Admitting: Urgent Care

## 2021-06-06 ENCOUNTER — Other Ambulatory Visit: Payer: Self-pay

## 2021-06-06 ENCOUNTER — Other Ambulatory Visit
Admission: RE | Admit: 2021-06-06 | Discharge: 2021-06-06 | Disposition: A | Discharge status: Home / Self Care | Payer: BC Managed Care – PPO | Source: Ambulatory Visit | Attending: General Surgery | Admitting: General Surgery

## 2021-06-06 DIAGNOSIS — Z01812 Encounter for preprocedural laboratory examination: Secondary | ICD-10-CM | POA: Insufficient documentation

## 2021-06-06 DIAGNOSIS — Z20822 Contact with and (suspected) exposure to covid-19: Secondary | ICD-10-CM | POA: Insufficient documentation

## 2021-06-06 LAB — COMPREHENSIVE METABOLIC PANEL
ALT: 29 U/L (ref 0–44)
AST: 20 U/L (ref 15–41)
Albumin: 4 g/dL (ref 3.5–5.0)
Alkaline Phosphatase: 62 U/L (ref 38–126)
Anion gap: 10 (ref 5–15)
BUN: 12 mg/dL (ref 6–20)
CO2: 27 mmol/L (ref 22–32)
Calcium: 9.1 mg/dL (ref 8.9–10.3)
Chloride: 101 mmol/L (ref 98–111)
Creatinine, Ser: 1.02 mg/dL (ref 0.61–1.24)
GFR, Estimated: >60 mL/min (ref >60)
Glucose, Bld: 104 mg/dL — ABNORMAL HIGH (ref 70–99)
Potassium: 3.7 mmol/L (ref 3.5–5.1)
Sodium: 138 mmol/L (ref 135–145)
Total Bilirubin: 0.8 mg/dL (ref 0.3–1.2)
Total Protein: 8 g/dL (ref 6.5–8.1)

## 2021-06-06 LAB — SARS CORONAVIRUS 2 (TAT 6-24 HRS): SARS Coronavirus 2: NEGATIVE

## 2021-06-08 ENCOUNTER — Encounter
Admission: RE | Disposition: A | Discharge status: Home / Self Care | Payer: Self-pay | Source: Ambulatory Visit | Attending: General Surgery

## 2021-06-08 ENCOUNTER — Inpatient Hospital Stay
Admission: RE | Admit: 2021-06-08 | Discharge: 2021-06-11 | DRG: 329 | Disposition: A | Discharge status: Home / Self Care | Payer: BC Managed Care – PPO | Source: Ambulatory Visit | Attending: General Surgery | Admitting: General Surgery

## 2021-06-08 ENCOUNTER — Inpatient Hospital Stay: Payer: BC Managed Care – PPO | Admitting: Certified Registered"

## 2021-06-08 ENCOUNTER — Other Ambulatory Visit: Payer: Self-pay

## 2021-06-08 ENCOUNTER — Encounter: Payer: Self-pay | Admitting: General Surgery

## 2021-06-08 DIAGNOSIS — Z20822 Contact with and (suspected) exposure to covid-19: Secondary | ICD-10-CM | POA: Diagnosis present

## 2021-06-08 DIAGNOSIS — Z5331 Laparoscopic surgical procedure converted to open procedure: Secondary | ICD-10-CM | POA: Diagnosis not present

## 2021-06-08 DIAGNOSIS — Z8249 Family history of ischemic heart disease and other diseases of the circulatory system: Secondary | ICD-10-CM | POA: Diagnosis not present

## 2021-06-08 DIAGNOSIS — Z433 Encounter for attention to colostomy: Principal | ICD-10-CM

## 2021-06-08 DIAGNOSIS — K651 Peritoneal abscess: Secondary | ICD-10-CM | POA: Diagnosis present

## 2021-06-08 DIAGNOSIS — Z933 Colostomy status: Secondary | ICD-10-CM

## 2021-06-08 DIAGNOSIS — Z87891 Personal history of nicotine dependence: Secondary | ICD-10-CM

## 2021-06-08 HISTORY — PX: XI ROBOTIC ASSISTED COLOSTOMY TAKEDOWN: SHX6828

## 2021-06-08 SURGERY — CLOSURE, COLOSTOMY, ROBOT-ASSISTED
Anesthesia: General | Site: Abdomen

## 2021-06-08 MED ORDER — METHYLPREDNISOLONE SODIUM SUCC 125 MG IJ SOLR
INTRAMUSCULAR | Status: DC | PRN
  Administered 2021-06-08: 125 mg via INTRAVENOUS

## 2021-06-08 MED ORDER — HYDROCODONE-ACETAMINOPHEN 7.5-325 MG PO TABS: 1.0000 | ORAL_TABLET | Freq: Once | ORAL | Status: DC | PRN

## 2021-06-08 MED ORDER — "VISTASEAL 4 ML SINGLE DOSE KIT "
PACK | CUTANEOUS | Status: DC | PRN
  Administered 2021-06-08: 4 mL via TOPICAL

## 2021-06-08 MED ORDER — HYDROCODONE-ACETAMINOPHEN 5-325 MG PO TABS
1.0000 | ORAL_TABLET | ORAL | Status: DC | PRN
  Administered 2021-06-08 – 2021-06-11 (×12): 2 via ORAL
  Filled 2021-06-08 (×12): qty 2

## 2021-06-08 MED ORDER — KETOROLAC TROMETHAMINE 30 MG/ML IJ SOLN
INTRAMUSCULAR | Status: AC
  Filled 2021-06-08: qty 1

## 2021-06-08 MED ORDER — ALVIMOPAN 12 MG PO CAPS
ORAL_CAPSULE | ORAL | Status: AC
  Administered 2021-06-08: 12 mg via ORAL
  Filled 2021-06-08: qty 1

## 2021-06-08 MED ORDER — SODIUM CHLORIDE FLUSH 0.9 % IV SOLN
INTRAVENOUS | Status: AC
  Filled 2021-06-08: qty 50

## 2021-06-08 MED ORDER — GABAPENTIN 300 MG PO CAPS
300.0000 mg | ORAL_CAPSULE | ORAL | Status: AC
Start: 2021-06-08 — End: 2021-06-08

## 2021-06-08 MED ORDER — ALBUTEROL SULFATE HFA 108 (90 BASE) MCG/ACT IN AERS
INHALATION_SPRAY | RESPIRATORY_TRACT | Status: DC | PRN
  Administered 2021-06-08: 3 via RESPIRATORY_TRACT

## 2021-06-08 MED ORDER — HYDROMORPHONE HCL 1 MG/ML IJ SOLN
INTRAMUSCULAR | Status: DC | PRN
  Administered 2021-06-08 (×2): .5 mg via INTRAVENOUS

## 2021-06-08 MED ORDER — KETAMINE HCL 50 MG/5ML IJ SOSY
PREFILLED_SYRINGE | INTRAMUSCULAR | Status: AC
  Filled 2021-06-08: qty 5

## 2021-06-08 MED ORDER — CHLORHEXIDINE GLUCONATE 0.12 % MT SOLN
OROMUCOSAL | Status: AC
  Administered 2021-06-08: 15 mL via OROMUCOSAL
  Filled 2021-06-08: qty 15

## 2021-06-08 MED ORDER — FAMOTIDINE 20 MG PO TABS
ORAL_TABLET | ORAL | Status: AC
  Administered 2021-06-08: 20 mg via ORAL
  Filled 2021-06-08: qty 1

## 2021-06-08 MED ORDER — SODIUM CHLORIDE (PF) 0.9 % IJ SOLN
INTRAMUSCULAR | Status: DC | PRN
  Administered 2021-06-08: 100 mL

## 2021-06-08 MED ORDER — HYDROMORPHONE HCL 1 MG/ML IJ SOLN
0.2500 mg | INTRAMUSCULAR | Status: DC | PRN
  Administered 2021-06-08 (×4): 0.5 mg via INTRAVENOUS

## 2021-06-08 MED ORDER — PROPOFOL 10 MG/ML IV BOLUS
INTRAVENOUS | Status: AC
  Filled 2021-06-08: qty 40

## 2021-06-08 MED ORDER — LIDOCAINE HCL (CARDIAC) PF 100 MG/5ML IV SOSY
PREFILLED_SYRINGE | INTRAVENOUS | Status: DC | PRN
  Administered 2021-06-08: 100 mg via INTRAVENOUS

## 2021-06-08 MED ORDER — PANTOPRAZOLE SODIUM 40 MG IV SOLR
40.0000 mg | Freq: Every day | INTRAVENOUS | Status: DC
  Administered 2021-06-08 – 2021-06-09 (×2): 40 mg via INTRAVENOUS
  Filled 2021-06-08 (×2): qty 40

## 2021-06-08 MED ORDER — ORAL CARE MOUTH RINSE: 15.0000 mL | Freq: Once | OROMUCOSAL | Status: AC

## 2021-06-08 MED ORDER — ROCURONIUM BROMIDE 100 MG/10ML IV SOLN
INTRAVENOUS | Status: DC | PRN
  Administered 2021-06-08: 50 mg via INTRAVENOUS
  Administered 2021-06-08 (×2): 20 mg via INTRAVENOUS
  Administered 2021-06-08: 30 mg via INTRAVENOUS
  Administered 2021-06-08 (×4): 20 mg via INTRAVENOUS
  Administered 2021-06-08: 10 mg via INTRAVENOUS
  Administered 2021-06-08: 50 mg via INTRAVENOUS
  Administered 2021-06-08 (×3): 20 mg via INTRAVENOUS

## 2021-06-08 MED ORDER — 0.9 % SODIUM CHLORIDE (POUR BTL) OPTIME
TOPICAL | Status: DC | PRN
  Administered 2021-06-08 (×2): 1000 mL

## 2021-06-08 MED ORDER — LACTATED RINGERS IV SOLN: INTRAVENOUS | Status: DC

## 2021-06-08 MED ORDER — SUGAMMADEX SODIUM 500 MG/5ML IV SOLN
INTRAVENOUS | Status: DC | PRN
  Administered 2021-06-08: 400 mg via INTRAVENOUS

## 2021-06-08 MED ORDER — FENTANYL CITRATE (PF) 100 MCG/2ML IJ SOLN
INTRAMUSCULAR | Status: AC
  Filled 2021-06-08: qty 2

## 2021-06-08 MED ORDER — SODIUM CHLORIDE 0.9 % IV SOLN
INTRAVENOUS | Status: AC
  Filled 2021-06-08: qty 2

## 2021-06-08 MED ORDER — ACETAMINOPHEN 500 MG PO TABS: 1000.0000 mg | ORAL_TABLET | ORAL | Status: AC

## 2021-06-08 MED ORDER — PROPOFOL 500 MG/50ML IV EMUL
INTRAVENOUS | Status: DC | PRN
  Administered 2021-06-08: 50 ug/kg/min via INTRAVENOUS

## 2021-06-08 MED ORDER — BUPIVACAINE-EPINEPHRINE (PF) 0.25% -1:200000 IJ SOLN
INTRAMUSCULAR | Status: AC
  Filled 2021-06-08: qty 30

## 2021-06-08 MED ORDER — HYDROMORPHONE HCL 1 MG/ML IJ SOLN
INTRAMUSCULAR | Status: AC
  Filled 2021-06-08: qty 1

## 2021-06-08 MED ORDER — SODIUM CHLORIDE 0.9 % IV SOLN
2.0000 g | INTRAVENOUS | Status: AC
  Administered 2021-06-08 (×2): 2 g via INTRAVENOUS

## 2021-06-08 MED ORDER — PROPOFOL 10 MG/ML IV BOLUS
INTRAVENOUS | Status: DC | PRN
  Administered 2021-06-08: 200 mg via INTRAVENOUS

## 2021-06-08 MED ORDER — MORPHINE SULFATE (PF) 4 MG/ML IV SOLN
4.0000 mg | INTRAVENOUS | Status: DC | PRN
  Administered 2021-06-08 – 2021-06-11 (×10): 4 mg via INTRAVENOUS
  Filled 2021-06-08 (×10): qty 1

## 2021-06-08 MED ORDER — BUPIVACAINE LIPOSOME 1.3 % IJ SUSP
INTRAMUSCULAR | Status: AC
  Filled 2021-06-08: qty 20

## 2021-06-08 MED ORDER — MIDAZOLAM HCL 2 MG/2ML IJ SOLN
INTRAMUSCULAR | Status: DC | PRN
  Administered 2021-06-08: 2 mg via INTRAVENOUS

## 2021-06-08 MED ORDER — ONDANSETRON HCL 4 MG/2ML IJ SOLN: 4.0000 mg | Freq: Four times a day (QID) | INTRAMUSCULAR | Status: DC | PRN

## 2021-06-08 MED ORDER — CELECOXIB 200 MG PO CAPS: 200.0000 mg | ORAL_CAPSULE | ORAL | Status: AC

## 2021-06-08 MED ORDER — FAMOTIDINE 20 MG PO TABS: 20.0000 mg | ORAL_TABLET | Freq: Once | ORAL | Status: AC

## 2021-06-08 MED ORDER — GABAPENTIN 300 MG PO CAPS
ORAL_CAPSULE | ORAL | Status: AC
  Administered 2021-06-08: 300 mg via ORAL
  Filled 2021-06-08: qty 1

## 2021-06-08 MED ORDER — ACETAMINOPHEN 500 MG PO TABS
ORAL_TABLET | ORAL | Status: AC
  Administered 2021-06-08: 1000 mg via ORAL
  Filled 2021-06-08: qty 2

## 2021-06-08 MED ORDER — PHENYLEPHRINE HCL (PRESSORS) 10 MG/ML IV SOLN
INTRAVENOUS | Status: DC | PRN
  Administered 2021-06-08: 100 ug via INTRAVENOUS

## 2021-06-08 MED ORDER — ALVIMOPAN 12 MG PO CAPS: 12.0000 mg | ORAL_CAPSULE | ORAL | Status: AC

## 2021-06-08 MED ORDER — SUCCINYLCHOLINE CHLORIDE 200 MG/10ML IV SOSY
PREFILLED_SYRINGE | INTRAVENOUS | Status: DC | PRN
  Administered 2021-06-08: 120 mg via INTRAVENOUS

## 2021-06-08 MED ORDER — FENTANYL CITRATE (PF) 100 MCG/2ML IJ SOLN
INTRAMUSCULAR | Status: DC | PRN
  Administered 2021-06-08 (×4): 50 ug via INTRAVENOUS
  Administered 2021-06-08: 100 ug via INTRAVENOUS

## 2021-06-08 MED ORDER — SODIUM CHLORIDE 0.9 % IV SOLN: INTRAVENOUS | Status: DC

## 2021-06-08 MED ORDER — ENOXAPARIN SODIUM 40 MG/0.4ML IJ SOSY
40.0000 mg | PREFILLED_SYRINGE | INTRAMUSCULAR | Status: DC
  Administered 2021-06-09 – 2021-06-11 (×3): 40 mg via SUBCUTANEOUS
  Filled 2021-06-08 (×3): qty 0.4

## 2021-06-08 MED ORDER — KETAMINE HCL 10 MG/ML IJ SOLN
INTRAMUSCULAR | Status: DC | PRN
  Administered 2021-06-08: 20 mg via INTRAVENOUS
  Administered 2021-06-08: 30 mg via INTRAVENOUS

## 2021-06-08 MED ORDER — ALBUMIN HUMAN 5 % IV SOLN
INTRAVENOUS | Status: AC
  Filled 2021-06-08: qty 250

## 2021-06-08 MED ORDER — BUPIVACAINE LIPOSOME 1.3 % IJ SUSP: 20.0000 mL | Freq: Once | INTRAMUSCULAR | Status: DC

## 2021-06-08 MED ORDER — GLYCOPYRROLATE 0.2 MG/ML IJ SOLN
INTRAMUSCULAR | Status: DC | PRN
Start: 2021-06-08 — End: 2021-06-08
  Administered 2021-06-08: .2 mg via INTRAVENOUS

## 2021-06-08 MED ORDER — CELECOXIB 200 MG PO CAPS
200.0000 mg | ORAL_CAPSULE | Freq: Two times a day (BID) | ORAL | Status: DC
  Administered 2021-06-08 – 2021-06-11 (×6): 200 mg via ORAL
  Filled 2021-06-08 (×8): qty 1

## 2021-06-08 MED ORDER — ONDANSETRON HCL 4 MG/2ML IJ SOLN
INTRAMUSCULAR | Status: DC | PRN
Start: 2021-06-08 — End: 2021-06-08
  Administered 2021-06-08 (×2): 4 mg via INTRAVENOUS

## 2021-06-08 MED ORDER — GABAPENTIN 300 MG PO CAPS
300.0000 mg | ORAL_CAPSULE | Freq: Two times a day (BID) | ORAL | Status: DC
  Administered 2021-06-08 – 2021-06-11 (×6): 300 mg via ORAL
  Filled 2021-06-08 (×6): qty 1

## 2021-06-08 MED ORDER — CHLORHEXIDINE GLUCONATE 0.12 % MT SOLN: 15.0000 mL | Freq: Once | OROMUCOSAL | Status: AC

## 2021-06-08 MED ORDER — DEXMEDETOMIDINE (PRECEDEX) IN NS 20 MCG/5ML (4 MCG/ML) IV SYRINGE
PREFILLED_SYRINGE | INTRAVENOUS | Status: DC | PRN
  Administered 2021-06-08: 12 ug via INTRAVENOUS
  Administered 2021-06-08: 8 ug via INTRAVENOUS

## 2021-06-08 MED ORDER — CHLORHEXIDINE GLUCONATE CLOTH 2 % EX PADS
6.0000 | MEDICATED_PAD | Freq: Every day | CUTANEOUS | Status: DC
  Administered 2021-06-08: 6 via TOPICAL

## 2021-06-08 MED ORDER — ALBUMIN HUMAN 5 % IV SOLN: INTRAVENOUS | Status: DC | PRN

## 2021-06-08 MED ORDER — PROPOFOL 500 MG/50ML IV EMUL
INTRAVENOUS | Status: AC
  Filled 2021-06-08: qty 50

## 2021-06-08 MED ORDER — MIDAZOLAM HCL 2 MG/2ML IJ SOLN
INTRAMUSCULAR | Status: AC
  Filled 2021-06-08: qty 2

## 2021-06-08 MED ORDER — SEVOFLURANE IN SOLN
RESPIRATORY_TRACT | Status: AC
  Filled 2021-06-08: qty 250

## 2021-06-08 MED ORDER — IPRATROPIUM-ALBUTEROL 20-100 MCG/ACT IN AERS
INHALATION_SPRAY | RESPIRATORY_TRACT | Status: DC | PRN
  Administered 2021-06-08: 1 via RESPIRATORY_TRACT

## 2021-06-08 MED ORDER — ONDANSETRON 4 MG PO TBDP: 4.0000 mg | ORAL_TABLET | Freq: Four times a day (QID) | ORAL | Status: DC | PRN

## 2021-06-08 MED ORDER — CELECOXIB 200 MG PO CAPS
ORAL_CAPSULE | ORAL | Status: AC
  Administered 2021-06-08: 200 mg via ORAL
  Filled 2021-06-08: qty 1

## 2021-06-08 MED ORDER — IPRATROPIUM-ALBUTEROL 0.5-2.5 (3) MG/3ML IN SOLN
RESPIRATORY_TRACT | Status: AC
  Filled 2021-06-08: qty 3

## 2021-06-08 SURGICAL SUPPLY — 114 items
APPLICATOR VISTASEAL 35 (MISCELLANEOUS) ×2 IMPLANT
BLADE CLIPPER SURG (BLADE) ×2 IMPLANT
BLADE SURG SZ10 CARB STEEL (BLADE) ×2 IMPLANT
BLADE SURG SZ11 CARB STEEL (BLADE) ×2 IMPLANT
BULB RESERV EVAC DRAIN JP 100C (MISCELLANEOUS) ×2 IMPLANT
CANNULA REDUC XI 12-8 STAPL (CANNULA) ×1
CANNULA REDUCER 12-8 DVNC XI (CANNULA) ×1 IMPLANT
CHLORAPREP W/TINT 26 (MISCELLANEOUS) ×2 IMPLANT
CLIP LIGATING HEMO O LOK GREEN (MISCELLANEOUS) IMPLANT
COVER TIP SHEARS 8 DVNC (MISCELLANEOUS) ×1 IMPLANT
COVER TIP SHEARS 8MM DA VINCI (MISCELLANEOUS) ×1
DEFOGGER SCOPE WARMER CLEARIFY (MISCELLANEOUS) ×2 IMPLANT
DERMABOND ADVANCED (GAUZE/BANDAGES/DRESSINGS) ×1
DERMABOND ADVANCED .7 DNX12 (GAUZE/BANDAGES/DRESSINGS) ×1 IMPLANT
DRAIN CHANNEL JP 19F (MISCELLANEOUS) ×2 IMPLANT
DRAPE ARM DVNC X/XI (DISPOSABLE) ×4 IMPLANT
DRAPE COLUMN DVNC XI (DISPOSABLE) ×1 IMPLANT
DRAPE DA VINCI XI ARM (DISPOSABLE) ×4
DRAPE DA VINCI XI COLUMN (DISPOSABLE) ×1
DRAPE INCISE IOBAN 66X45 STRL (DRAPES) ×2 IMPLANT
DRAPE LEGGINS SURG 28X43 STRL (DRAPES) ×2 IMPLANT
DRAPE UNDER BUTTOCK W/FLU (DRAPES) ×2 IMPLANT
DRSG OPSITE POSTOP 3X4 (GAUZE/BANDAGES/DRESSINGS) ×8 IMPLANT
DRSG OPSITE POSTOP 4X10 (GAUZE/BANDAGES/DRESSINGS) IMPLANT
DRSG OPSITE POSTOP 4X14 (GAUZE/BANDAGES/DRESSINGS) ×2 IMPLANT
DRSG OPSITE POSTOP 4X8 (GAUZE/BANDAGES/DRESSINGS) IMPLANT
ELECT BLADE 6.5 EXT (BLADE) ×2 IMPLANT
ELECT CAUTERY BLADE 6.4 (BLADE) ×2 IMPLANT
ELECT REM PT RETURN 9FT ADLT (ELECTROSURGICAL) ×2
ELECTRODE REM PT RTRN 9FT ADLT (ELECTROSURGICAL) ×1 IMPLANT
GAUZE 4X4 16PLY ~~LOC~~+RFID DBL (SPONGE) ×2 IMPLANT
GLOVE BIOGEL PI IND STRL 7.5 (GLOVE) ×5 IMPLANT
GLOVE BIOGEL PI INDICATOR 7.5 (GLOVE) ×5
GLOVE PROTEXIS LATEX SZ 7.5 (GLOVE) ×2 IMPLANT
GLOVE SURG ENC MOIS LTX SZ6.5 (GLOVE) ×16 IMPLANT
GLOVE SURG UNDER POLY LF SZ6.5 (GLOVE) ×16 IMPLANT
GOWN STRL REUS W/ TWL LRG LVL3 (GOWN DISPOSABLE) ×11 IMPLANT
GOWN STRL REUS W/TWL LRG LVL3 (GOWN DISPOSABLE) ×11
GRASPER SUT TROCAR 14GX15 (MISCELLANEOUS) ×2 IMPLANT
HANDLE YANKAUER SUCT BULB TIP (MISCELLANEOUS) ×4 IMPLANT
IRRIGATION STRYKERFLOW (MISCELLANEOUS) IMPLANT
IRRIGATOR STRYKERFLOW (MISCELLANEOUS)
IRRIGATOR SUCT 8 DISP DVNC XI (IRRIGATION / IRRIGATOR) ×1 IMPLANT
IRRIGATOR SUCTION 8MM XI DISP (IRRIGATION / IRRIGATOR) ×1
IV NS 1000ML (IV SOLUTION) ×1
IV NS 1000ML BAXH (IV SOLUTION) ×1 IMPLANT
KIT OSTOMY DRAINABLE 2.75 STR (WOUND CARE) ×2 IMPLANT
KIT PINK PAD W/HEAD ARE REST (MISCELLANEOUS) ×2
KIT PINK PAD W/HEAD ARM REST (MISCELLANEOUS) ×1 IMPLANT
LABEL OR SOLS (LABEL) ×2 IMPLANT
MANIFOLD NEPTUNE II (INSTRUMENTS) ×2 IMPLANT
NEEDLE HYPO 22GX1.5 SAFETY (NEEDLE) ×2 IMPLANT
NEEDLE INSUFFLATION 14GA 120MM (NEEDLE) ×2 IMPLANT
OBTURATOR OPTICAL STANDARD 8MM (TROCAR) ×1
OBTURATOR OPTICAL STND 8 DVNC (TROCAR) ×1
OBTURATOR OPTICALSTD 8 DVNC (TROCAR) ×1 IMPLANT
PACK COLON CLEAN CLOSURE (MISCELLANEOUS) IMPLANT
PACK LAP CHOLECYSTECTOMY (MISCELLANEOUS) ×2 IMPLANT
PENCIL ELECTRO HAND CTR (MISCELLANEOUS) ×2 IMPLANT
PORT ACCESS TROCAR AIRSEAL 5 (TROCAR) ×2 IMPLANT
RELOAD STAPLER 3.5X45 BLU DVNC (STAPLE) IMPLANT
RELOAD STAPLER 3.5X60 BLU DVNC (STAPLE) ×1 IMPLANT
RETRACTOR RING XSMALL (MISCELLANEOUS) IMPLANT
RTRCTR WOUND ALEXIS 13CM XS SH (MISCELLANEOUS)
SEAL CANN UNIV 5-8 DVNC XI (MISCELLANEOUS) ×3 IMPLANT
SEAL XI 5MM-8MM UNIVERSAL (MISCELLANEOUS) ×3
SEALER VESSEL DA VINCI XI (MISCELLANEOUS) ×1
SEALER VESSEL EXT DVNC XI (MISCELLANEOUS) ×1 IMPLANT
SET TRI-LUMEN FLTR TB AIRSEAL (TUBING) ×2 IMPLANT
SOL PREP PVP 2OZ (MISCELLANEOUS) ×2
SOLUTION ELECTROLUBE (MISCELLANEOUS) ×2 IMPLANT
SOLUTION PREP PVP 2OZ (MISCELLANEOUS) ×1 IMPLANT
SPONGE DRAIN TRACH 4X4 STRL 2S (GAUZE/BANDAGES/DRESSINGS) ×2 IMPLANT
SPONGE T-LAP 18X18 ~~LOC~~+RFID (SPONGE) ×6 IMPLANT
SPONGE T-LAP 18X36 ~~LOC~~+RFID STR (SPONGE) ×4 IMPLANT
SPONGE T-LAP 4X18 ~~LOC~~+RFID (SPONGE) IMPLANT
STAPLER 45 DA VINCI SURE FORM (STAPLE)
STAPLER 45 SUREFORM DVNC (STAPLE) IMPLANT
STAPLER 60 DA VINCI SURE FORM (STAPLE) ×1
STAPLER 60 SUREFORM DVNC (STAPLE) ×1 IMPLANT
STAPLER CANNULA SEAL DVNC XI (STAPLE) ×1 IMPLANT
STAPLER CANNULA SEAL XI (STAPLE) ×1
STAPLER CIRCULAR MANUAL XL 29 (STAPLE) ×2 IMPLANT
STAPLER RELOAD 3.5X45 BLU DVNC (STAPLE)
STAPLER RELOAD 3.5X45 BLUE (STAPLE)
STAPLER RELOAD 3.5X60 BLU DVNC (STAPLE) ×1
STAPLER RELOAD 3.5X60 BLUE (STAPLE) ×1
STAPLER SKIN PROX 35W (STAPLE) ×2 IMPLANT
STAPLER SYS INTERNAL RELOAD SS (MISCELLANEOUS) ×2 IMPLANT
SURGILUBE 2OZ TUBE FLIPTOP (MISCELLANEOUS) ×2 IMPLANT
SUT ETHILON 3-0 (SUTURE) ×2 IMPLANT
SUT MNCRL 4-0 (SUTURE) ×2
SUT MNCRL 4-0 27XMFL (SUTURE) ×2
SUT PDS PLUS 0 (SUTURE) ×2
SUT PDS PLUS AB 0 CT-2 (SUTURE) ×2 IMPLANT
SUT PROLENE 2 0 FS (SUTURE) ×2 IMPLANT
SUT SILK 0 SH 30 (SUTURE) ×4 IMPLANT
SUT SILK 3-0 (SUTURE) IMPLANT
SUT VIC AB 2-0 CT1 36 (SUTURE) ×4 IMPLANT
SUT VIC AB 3-0 SH 27 (SUTURE) ×4
SUT VIC AB 3-0 SH 27X BRD (SUTURE) ×4 IMPLANT
SUT VIC AB 3-0 SH 8-18 (SUTURE) ×4 IMPLANT
SUT VICRYL 0 AB UR-6 (SUTURE) ×6 IMPLANT
SUT VICRYL 3-0 SH-1 18IN (SUTURE) ×2 IMPLANT
SUT VICRYL+ 3-0 36IN CT-1 (SUTURE) ×2 IMPLANT
SUT VLOC 90 6 CV-15 VIOLET (SUTURE) IMPLANT
SUTURE MNCRL 4-0 27XMF (SUTURE) ×2 IMPLANT
SYR 30ML LL (SYRINGE) ×2 IMPLANT
SYS LAPSCP GELPORT 120MM (MISCELLANEOUS) ×2
SYS TROCAR 1.5-3 SLV ABD GEL (ENDOMECHANICALS)
SYSTEM LAPSCP GELPORT 120MM (MISCELLANEOUS) ×1 IMPLANT
SYSTEM TROCR 1.5-3 SLV ABD GEL (ENDOMECHANICALS) IMPLANT
TRAY FOLEY MTR SLVR 16FR STAT (SET/KITS/TRAYS/PACK) ×2 IMPLANT
WATER STERILE IRR 500ML POUR (IV SOLUTION) ×2 IMPLANT

## 2021-06-08 NOTE — Anesthesia Preprocedure Evaluation (Addendum)
Anesthesia Evaluation  Patient identified by MRN, date of birth, ID band Patient awake    Reviewed: Allergy & Precautions, NPO status , Patient's Chart, lab work & pertinent test results  Airway Mallampati: III  TM Distance: >3 FB Neck ROM: full    Dental no notable dental hx.    Pulmonary neg pulmonary ROS,    Pulmonary exam normal        Cardiovascular negative cardio ROS Normal cardiovascular exam     Neuro/Psych negative neurological ROS  negative psych ROS   GI/Hepatic Neg liver ROS, Diverticulitis   Endo/Other  negative endocrine ROS  Renal/GU      Musculoskeletal   Abdominal (+) + obese,   Peds  Hematology negative hematology ROS (+)   Anesthesia Other Findings No past medical history on file.  Past Surgical History: No date: COLECTOMY WITH COLOSTOMY CREATION/HARTMANN PROCEDURE No date: COLONOSCOPY 11/23/2020: IR RADIOLOGIST EVAL & MGMT 12/14/2020: IR RADIOLOGIST EVAL & MGMT     Reproductive/Obstetrics negative OB ROS                            Anesthesia Physical Anesthesia Plan  ASA: 2  Anesthesia Plan: General ETT   Post-op Pain Management:    Induction: Intravenous  PONV Risk Score and Plan: Ondansetron, Dexamethasone, Midazolam and Treatment may vary due to age or medical condition  Airway Management Planned: Oral ETT  Additional Equipment:   Intra-op Plan:   Post-operative Plan: Extubation in OR  Informed Consent: I have reviewed the patients History and Physical, chart, labs and discussed the procedure including the risks, benefits and alternatives for the proposed anesthesia with the patient or authorized representative who has indicated his/her understanding and acceptance.     Dental Advisory Given  Plan Discussed with: Anesthesiologist, CRNA and Surgeon  Anesthesia Plan Comments: (Patient consented for risks of anesthesia including but not  limited to:  - adverse reactions to medications - damage to eyes, teeth, lips or other oral mucosa - nerve damage due to positioning  - sore throat or hoarseness - Damage to heart, brain, nerves, lungs, other parts of body or loss of life  Patient voiced understanding.)        Anesthesia Quick Evaluation

## 2021-06-08 NOTE — Anesthesia Procedure Notes (Signed)
Procedure Name: Intubation Date/Time: 06/08/2021 8:39 AM Performed by: Jaye Beagle, CRNA Pre-anesthesia Checklist: Patient identified, Emergency Drugs available, Suction available and Patient being monitored Patient Re-evaluated:Patient Re-evaluated prior to induction Oxygen Delivery Method: Circle system utilized Preoxygenation: Pre-oxygenation with 100% oxygen Induction Type: IV induction Ventilation: Mask ventilation without difficulty Laryngoscope Size: McGraph and 4 Grade View: Grade I Tube type: Oral Tube size: 8.0 mm Number of attempts: 1 Airway Equipment and Method: Stylet and Oral airway Placement Confirmation: ETT inserted through vocal cords under direct vision, positive ETCO2 and breath sounds checked- equal and bilateral Secured at: 23 cm Tube secured with: Tape Dental Injury: Teeth and Oropharynx as per pre-operative assessment

## 2021-06-08 NOTE — Transfer of Care (Signed)
Immediate Anesthesia Transfer of Care Note  Patient: Mark Spencer  Procedure(s) Performed: XI ROBOTIC ASSISTED COLOSTOMY TAKEDOWN CONVERTED TO OPEN PROCEDURE (Abdomen)  Patient Location: PACU  Anesthesia Type:General  Level of Consciousness: drowsy  Airway & Oxygen Therapy: Patient Spontanous Breathing and Patient connected to face mask oxygen  Post-op Assessment: Report given to RN  Post vital signs: stable  Last Vitals:  Vitals Value Taken Time  BP 140/85 06/08/21 1705  Temp    Pulse 97 06/08/21 1706  Resp 14 06/08/21 1706  SpO2 98 % 06/08/21 1706  Vitals shown include unvalidated device data.  Last Pain:  Vitals:   06/08/21 0800  TempSrc: Oral  PainSc: 0-No pain         Complications: No notable events documented.

## 2021-06-08 NOTE — Plan of Care (Signed)

## 2021-06-08 NOTE — Interval H&P Note (Signed)
History and Physical Interval Note:  06/08/2021 8:06 AM  Mark Spencer  has presented today for surgery, with the diagnosis of Z93.3 Colostomy status.  The various methods of treatment have been discussed with the patient and family. After consideration of risks, benefits and other options for treatment, the patient has consented to  Procedure(s): XI ROBOTIC ASSISTED COLOSTOMY TAKEDOWN (N/A) as a surgical intervention.  The patient's history has been reviewed, patient examined, no change in status, stable for surgery.  I have reviewed the patient's chart and labs.  Questions were answered to the patient's satisfaction.     Carolan Shiver

## 2021-06-08 NOTE — Op Note (Signed)
Preoperative diagnosis: History of complicated diverticulitis  Colostomy status  Postoperative diagnosis: History of complicated diverticulitis  Colostomy status  Procedure: Robotic assisted laparoscopic converted to open colostomy reversal Protective loop ileostomy creation   Anesthesia: GETA   Surgeon: Carolan Shiver, MD  Assistant: Dr. Tonna Boehringer    Wound Classification: Contaminated   Specimen: Mesenteric abscess   Complications: None   Estimated Blood Loss: 100 mL   Indications: Patient is a 35 y.o. male with history of perforated diverticulitis s/p Hartmans procedure comes today for colostomy reversal.     FIndings: 1.  Rectal mesentery replaced by abscess cavity 2.  Positive air leak test after anastomosis able to be fixed with interrupted sutures. 3.  Protective loop ileostomy created to list the anastomosis heal due to very complex surgery and positive blood test.    Description of procedure: The patient was placed on the operating table in the lithotomy position, both arms tucked. General anesthesia was induced. A time-out was completed verifying correct patient, procedure, site, positioning, and implant(s) and/or special equipment prior to beginning this procedure. The abdomen was prepped and draped in the usual sterile fashion.    A Veress needle was inserted in the Palmer's point.   Abdominal cavity was insufflated to 15 mmHg. Patient tolerated insufflation well.  Four ports one 12 mm trocar and three 8 mm trocars were made along the right side of the abdominal wall. 5 mm assistant port was then placed on the right subcostal area.  No injuries from trocar placements were noted. The table was placed in the Trendelenburg position.  Xi robotic platform was then brought to the operative field and docked at an angle from the left lower quadrant.  Tip up grasper and fenestrated bipolar were placed in the left arm ports.  Scissors were placed in right arm port.   Upon  entering the abdomen a chronic abscess with purulence was identified in the pelvis.  Abscess was drained with suction.  A very difficult and time-consuming dissection was done in the rectum to be able to identify the rectal stump.  After allowing from time to identify the left side of the rectum I decided to convert the surgery to hand-assisted laparoscopic surgery.  An elliptical incision was done around the colostomy.  The colostomy was taken down circumferentially.  An anvil 29 mm was placed on the descending colon with pressure in device.  GelPort was placed in the colostomy wound.  With my hand I tried to palpate the rectal stump but the indurated mesenteric abscess cavity was not letting identify any planes to dissect on the left lateral aspect of the rectum.  Decision was to proceed to open colostomy takedown.  Using a #10 blade a vertical low midline incision was made overlying the previous midline incisional scar. This was depended through the subcutaneous tissues using electrocautery. Hemostasis was achieved and maintained throughout the case using electrocautery. The fascia was identified and incised and the peritoneal cavity was entered.   A self-retaining abdominal wall retractor was placed and the small bowel was carefully retracted into the right upper quadrant.  Examination of finger fracture and electrocautery was done to mobilize a little bit more the left lateral aspect of the rectum.  He was also a very difficult and time-consuming dissection.  He finally was able to get an adequate amount of rectal stump still with an indurated mesentery.  The distal colon was mobilized and freed from any adjacent adhesions during which time the left ureter was identified  and protected within the retroperitoneum.   The left colon was mobilized by incising the white line of toldt  A tension-free anastomosis was created between the proximal and distal colon in the following manner.   A 29 Fr EEA stapler  was advanced carefully by an Geophysicist/field seismologist from below. The antimesenteric boarders were aligned. The EEA stapler was attached to the anvil and fired. The stapler was removed and the rings of tissue retained in the stapler were inspected and found to be incomplete at the distal margin.   The anastomosis was tested for patency and integrity by filling the abdomen with saline and insufflating the rectum with air while occluding the colon proximal to the anastomosis.  The leak test was positive.  The bubbles were coming from the anterior wall.  2 interrupted 3-0 Vicryl sutures were placed.  The leak test was repeated and no bubbles were noted and air was seen to pass across the anastomosis proximally. The abdomen was copiously irrigated with normal saline and the self-retaining retractor was removed.   Due to the difficulty of the surgery, the indurated mesentery and positive leak test if decided to do a protective loop ileostomy.  A loop of the ileum was exteriorized through the previous colostomy incision.  Hemostasis was checked.  A 19 French drain was placed in the pelvis and exteriorized through the right abdomen.  The fascia was close with a running suture of looped 0-PDS. The skin was closed with staples.   The loop ileostomy was then matured circumferentially.  Honeycomb dressings were placed in all incisions.  Ileostomy appliance were applied.  The patient tolerated the procedure well and was extubated and taken to the postoperative care unit in stable condition accompanied by the surgical and anesthesia teams.

## 2021-06-08 NOTE — Progress Notes (Signed)
Dr. Duanne Guess called and notified that patients JP drain would not stay depressed it would inflate shortly after depressing, but there would be no content pulled just air in bulb. Per Dr. Lady Gary it is ok and will be addressed in the morning, no acute findings with patient condition involving JP re-inflating. Will continue to monitor patient.

## 2021-06-09 ENCOUNTER — Encounter: Payer: Self-pay | Admitting: General Surgery

## 2021-06-09 LAB — PHOSPHORUS: Phosphorus: 4.5 mg/dL (ref 2.5–4.6)

## 2021-06-09 LAB — CBC
HCT: 42.1 % (ref 39.0–52.0)
Hemoglobin: 14.1 g/dL (ref 13.0–17.0)
MCH: 30.7 pg (ref 26.0–34.0)
MCHC: 33.5 g/dL (ref 30.0–36.0)
MCV: 91.7 fL (ref 80.0–100.0)
Platelets: 243 10*3/uL (ref 150–400)
RBC: 4.59 MIL/uL (ref 4.22–5.81)
RDW: 11.8 % (ref 11.5–15.5)
WBC: 18 10*3/uL — ABNORMAL HIGH (ref 4.0–10.5)
nRBC: 0 % (ref 0.0–0.2)

## 2021-06-09 LAB — BASIC METABOLIC PANEL
Anion gap: 10 (ref 5–15)
BUN: 14 mg/dL (ref 6–20)
CO2: 30 mmol/L (ref 22–32)
Calcium: 8.6 mg/dL — ABNORMAL LOW (ref 8.9–10.3)
Chloride: 99 mmol/L (ref 98–111)
Creatinine, Ser: 1.24 mg/dL (ref 0.61–1.24)
GFR, Estimated: >60 mL/min (ref >60)
Glucose, Bld: 124 mg/dL — ABNORMAL HIGH (ref 70–99)
Potassium: 3.8 mmol/L (ref 3.5–5.1)
Sodium: 139 mmol/L (ref 135–145)

## 2021-06-09 LAB — MAGNESIUM: Magnesium: 2.1 mg/dL (ref 1.7–2.4)

## 2021-06-09 NOTE — Consult Note (Signed)
WOC Nurse ostomy follow up Patient receiving care in [facility]. Stoma type/location: LUQ ileostomy (brought through his former colostomy opening) on 9/14. Stomal assessment/size: LUQ loop ileostomy Peristomal assessment: deferred; first pouch change Friday 9/16. Treatment options for stomal/peristomal skin: barrier ring Output: none yet  Ostomy pouching: 2pc. 2 and 3/4 inch on patient now, intact, no need to change. Education provided: Patient is connected with [facility] because of his former colostomy. Knows about pouch changing. I have given him the Ileostomy education folder from [facility] and explained some major differences (liquid stool, dietary concerns, high potential for skin breakdown from effluent). Patient is asking me to teach his wife at tomorrow's pouch change. Enrolled patient in [facility] Secure Start Discharge program: Patient already has an account with [facility] and is familiar with the 2 pc system and barrier ring.  Helmut Muster, RN, MSN, CWOCN, CNS-BC, pager [phone]

## 2021-06-09 NOTE — Progress Notes (Signed)
Patient ID: Mark Spencer, male   DOB: 1986/08/10, 35 y.o.   MRN: [id number]     SURGICAL PROGRESS NOTE   Hospital Day(s): 1.   Interval History: Patient seen and examined, no acute events or new complaints overnight. Patient reports feeling well.  He had a good night.  Pain control.  Vital signs in last 24 hours: [min-max] current  Temp:  [97.4 F (36.3 C)-97.8 F (36.6 C)] 97.8 F (36.6 C) (09/15 0423) Pulse Rate:  [86-99] 86 (09/15 0423) Resp:  [16-21] 20 (09/15 0423) BP: (125-156)/(79-117) 125/79 (09/15 0423) SpO2:  [92 %-100 %] 97 % (09/15 0423)     Height: 6\' 4"  (193 cm) Weight: 131.5 kg BMI (Calculated): 35.31   Physical Exam:  Constitutional: alert, cooperative and no distress  Respiratory: breathing non-labored at rest  Cardiovascular: regular rate and sinus rhythm  Gastrointestinal: soft, non-tender, and non-distended.  Wound is dry and clean.  Ileostomy pink and patent.  Labs:  CBC Latest Ref Rng & Units 06/09/2021 11/12/2020 11/10/2020  WBC 4.0 - 10.5 K/uL 18.0(H) 14.7(H) 23.3(H)  Hemoglobin 13.0 - 17.0 g/dL 11/12/2020 12.7(L) 13.5  Hematocrit 39.0 - 52.0 % 42.1 38.9(L) 39.2  Platelets 150 - 400 K/uL 243 291 261   CMP Latest Ref Rng & Units 06/09/2021 06/06/2021 11/07/2020  Glucose 70 - 99 mg/dL 11/09/2020) 623(J) 628(B)  BUN 6 - 20 mg/dL 14 12 19   Creatinine 0.61 - 1.24 mg/dL 151(V 6.16  Sodium 135 - 145 mmol/L 139 138 135  Potassium 3.5 - 5.1 mmol/L 3.8 3.7 3.8  Chloride 98 - 111 mmol/L 99 101 96(L)  CO2 22 - 32 mmol/L 30 27 28   Calcium 8.9 - 10.3 mg/dL 0.73) 9.1 7.10)  Total Protein 6.5 - 8.1 g/dL - 8.0 -  Total Bilirubin 0.3 - 1.2 mg/dL - 0.8 -  Alkaline Phos 38 - 126 U/L - 62 -  AST 15 - 41 U/L - 20 -  ALT 0 - 44 U/L - 29 -    Imaging studies: No new pertinent imaging studies   Assessment/Plan:  35 y.o. male with colostomy status 1 Day Post-Op s/p colostomy takedown with protective loop ileostomy.  Patient recovering adequately.  Stable vital signs.  No fever,  tachycardia.  Labs shows expected elevation white blood cell count.  Stable hemoglobin.  No significant electrolyte disturbance.  Patient had adequate urine output.  Will discontinue Foley catheter today.  I encouraged the patient to ambulate.  We will continue with pain management.  We will continue with DVT prophylaxis.  Ostomy nurse consulted for orientation of new loop ileostomy.  9.4(W, MD

## 2021-06-10 MED ORDER — KETOROLAC TROMETHAMINE 30 MG/ML IJ SOLN
30.0000 mg | Freq: Once | INTRAMUSCULAR | Status: AC
  Administered 2021-06-10: 30 mg via INTRAVENOUS
  Filled 2021-06-10: qty 1

## 2021-06-10 MED ORDER — PANTOPRAZOLE SODIUM 40 MG PO TBEC
40.0000 mg | DELAYED_RELEASE_TABLET | Freq: Every day | ORAL | Status: DC
  Administered 2021-06-10: 40 mg via ORAL
  Filled 2021-06-10: qty 1

## 2021-06-10 NOTE — Progress Notes (Signed)
Patient ID: Mark Spencer, male   DOB: 03-Apr-1986, 35 y.o.   MRN: [id number]     SURGICAL PROGRESS NOTE   Hospital Day(s): 2.   Interval History: Patient seen and examined, no acute events or new complaints overnight. Patient reports feeling well.  He endorses having expected surgical pain but controlled with current pain medications.  Endorses having a lot of gas in the bag and small amount of stool.  Denies any nausea or vomiting  Vital signs in last 24 hours: [min-max] current  Temp:  [97.8 F (36.6 C)-99 F (37.2 C)] 99 F (37.2 C) (09/16 0437) Pulse Rate:  [87-95] 87 (09/16 0437) Resp:  [16-20] 20 (09/15 2002) BP: (124-130)/(79-97) 124/85 (09/16 0437) SpO2:  [91 %-96 %] 95 % (09/16 0437)     Height: 6\' 4"  (193 cm) Weight: 131.5 kg BMI (Calculated): 35.31   Physical Exam:  Constitutional: alert, cooperative and no distress  Respiratory: breathing non-labored at rest  Cardiovascular: regular rate and sinus rhythm  Gastrointestinal: soft, non-tender, and non-distended.  Wound is dry and clean.  Ileostomy pink and patent.  Labs:  CBC Latest Ref Rng & Units 06/09/2021 11/12/2020 11/10/2020  WBC 4.0 - 10.5 K/uL 18.0(H) 14.7(H) 23.3(H)  Hemoglobin 13.0 - 17.0 g/dL 11/12/2020 12.7(L) 13.5  Hematocrit 39.0 - 52.0 % 42.1 38.9(L) 39.2  Platelets 150 - 400 K/uL 243 291 261   CMP Latest Ref Rng & Units 06/09/2021 06/06/2021 11/07/2020  Glucose 70 - 99 mg/dL 11/09/2020) 169(C) 789(F)  BUN 6 - 20 mg/dL 14 12 19   Creatinine 0.61 - 1.24 mg/dL 810(F 7.51  Sodium 135 - 145 mmol/L 139 138 135  Potassium 3.5 - 5.1 mmol/L 3.8 3.7 3.8  Chloride 98 - 111 mmol/L 99 101 96(L)  CO2 22 - 32 mmol/L 30 27 28   Calcium 8.9 - 10.3 mg/dL 0.25) 9.1 8.52)  Total Protein 6.5 - 8.1 g/dL - 8.0 -  Total Bilirubin 0.3 - 1.2 mg/dL - 0.8 -  Alkaline Phos 38 - 126 U/L - 62 -  AST 15 - 41 U/L - 20 -  ALT 0 - 44 U/L - 29 -    Imaging studies: No new pertinent imaging studies   Assessment/Plan:  35 y.o. male with  colostomy status 2 Day Post-Op s/p colostomy takedown with protective loop ileostomy.  Patient without any clinical deterioration.  No fever, no tachycardia.  Pain controlled.  He is passing gas and stool through the ileostomy.  Drain serosanguineous.  We will advance diet to soft diet.  We will continue with pain management.  I encouraged the patient to ambulate.  We will continue with DVT prophylaxis.  2.4(M, MD

## 2021-06-10 NOTE — Plan of Care (Signed)
  Problem: Health Behavior/Discharge Planning: Goal: Ability to manage health-related needs will improve Outcome: Progressing   Problem: Clinical Measurements: Goal: Cardiovascular complication will be avoided Outcome: Progressing   Problem: Pain Managment: Goal: General experience of comfort will improve Outcome: Progressing   Problem: Activity: Goal: Risk for activity intolerance will decrease Outcome: Progressing

## 2021-06-10 NOTE — Progress Notes (Signed)
Pt JP drain was leaking changed dressing twice. The JP drain is still not suctioning, Will notify incoming shift. Will continue to monitor.

## 2021-06-10 NOTE — Consult Note (Signed)
WOC Nurse ostomy follow up Patient receiving care in [facility].  Spouse in room. Stoma type/location: LUQ loop ileostomy (former colostomy site). Stomal assessment/size: 1 3/4 inches side to side, slightly less top to bottom. Moist, wet, red, staples along the medial aspect, all are intact. Loop support in place. Peristomal assessment: intact except there was a small Medical Adhesive Related Skin Injury (MARSI) when the operative skin barrier was removed.  It is located under the tape at the 5 o'clock position.  Crusted with stoma powder and skin film barrier. Wife performed 98% of pouch change process with very little help needed from me. Treatment options for stomal/peristomal skin: Crusting discussed and when to perform for peristomal skin breakdown. Barrier ring used today. Output: pudding consistency dark green Ostomy pouching: 2pc. 2 and 3/4 inch pouch, skin barrier, and barrier ring.   Education provided: As above Patient has existing account with [facility].  I have encouraged his wife to contact [facility] and ask for the current pouching products (if needed).  The patient's spouse indicated what they have at home is very similar to what we are using here.  Additional supplies obtained from [employer] utility and placed in the room.  Since both the patient and spouse are very familiar with pouching of the colostomy, they agreed no further teaching sessions are indicated.  I provided them with the information sheet for the [facility].  Helmut Muster, RN, MSN, CWOCN, CNS-BC, pager [phone]

## 2021-06-11 MED ORDER — OXYCODONE-ACETAMINOPHEN 5-325 MG PO TABS: 1.0000 | ORAL_TABLET | ORAL | 0 refills | Status: DC | PRN

## 2021-06-11 MED ORDER — OXYCODONE-ACETAMINOPHEN 5-325 MG PO TABS
1.0000 | ORAL_TABLET | ORAL | Status: DC | PRN
  Administered 2021-06-11 (×2): 1 via ORAL
  Filled 2021-06-11 (×2): qty 1

## 2021-06-11 NOTE — Discharge Summary (Signed)
  Patient ID: Mark Spencer MRN: [id number] DOB/AGE: 1986/01/28 35 y.o.  Admit date: 06/08/2021 Discharge date: 06/11/2021   Discharge Diagnoses:  Active Problems:   Colostomy status (HCC)   Procedures: Robotic assisted laparoscopic converted to open colostomy takedown Creation of protective loop ileostomy  Hospital Course: Patient with colostomy status.  He underwent a very difficult colostomy takedown.  Loop colostomy was created to protect anastomosis.  Patient has been recovering adequately.  Pain under control with current pain medications.  Patient ambulating.  Patient voiding spontaneously.  Patient tolerating solid diet.  Ileostomy working adequately.  There has been no fever or tachycardia during the admission.  Physical Exam HENT:     Head: Normocephalic.  Cardiovascular:     Rate and Rhythm: Normal rate.     Pulses: Normal pulses.  Pulmonary:     Effort: Pulmonary effort is normal.  Abdominal:     General: Abdomen is flat.     Palpations: Abdomen is soft.  Musculoskeletal:     Cervical back: Normal range of motion.  Skin:    General: Skin is warm.     Capillary Refill: Capillary refill takes less than 2 seconds.  Neurological:     Mental Status: He is alert and oriented to person, place, and time.  Ileostomy pink and patent   Consults: Ostomy nurse  Disposition: Discharge disposition: 01-Home or Self Care       Discharge Instructions     Diet - low sodium heart healthy   Complete by: As directed       Allergies as of 06/11/2021   No Known Allergies      Medication List     TAKE these medications    oxyCODONE-acetaminophen 5-325 MG tablet Commonly known as: Percocet Take 1 tablet by mouth every 4 (four) hours as needed for severe pain.        Follow-up Information     Carolan Shiver, MD Follow up in 2 week(s).   Specialty: General Surgery Contact information: [address] [place] [address] [phone]

## 2021-06-11 NOTE — Discharge Instructions (Signed)

## 2021-06-12 LAB — SURGICAL PATHOLOGY

## 2021-06-12 NOTE — Anesthesia Postprocedure Evaluation (Signed)
Anesthesia Post Note  Patient: Mark Spencer  Procedure(s) Performed: XI ROBOTIC ASSISTED COLOSTOMY TAKEDOWN CONVERTED TO OPEN PROCEDURE; ILEOSTOMY CREATION (Abdomen)  Patient location during evaluation: PACU Anesthesia Type: General Level of consciousness: awake and alert Pain management: pain level controlled Vital Signs Assessment: post-procedure vital signs reviewed and stable Respiratory status: spontaneous breathing, nonlabored ventilation, respiratory function stable and patient connected to nasal cannula oxygen Cardiovascular status: blood pressure returned to baseline and stable Postop Assessment: no apparent nausea or vomiting Anesthetic complications: no   No notable events documented.   Last Vitals:  Vitals:   06/11/21 0318 06/11/21 0744  BP: 122/78 134/82  Pulse: 78 85  Resp: 18 18  Temp: (!) 36.4 C (!) 36.3 C  SpO2: 100% 98%    Last Pain:  Vitals:   06/11/21 1526  TempSrc:   PainSc: 4                  Lenard Simmer

## 2021-06-17 ENCOUNTER — Encounter: Payer: Self-pay | Admitting: General Surgery

## 2021-08-16 ENCOUNTER — Other Ambulatory Visit: Payer: Self-pay | Admitting: General Surgery

## 2021-08-16 ENCOUNTER — Ambulatory Visit: Payer: Self-pay | Admitting: General Surgery

## 2021-08-16 DIAGNOSIS — Z932 Ileostomy status: Secondary | ICD-10-CM

## 2021-08-16 NOTE — H&P (View-Only) (Signed)
HISTORY OF PRESENT ILLNESS:    Mark Spencer is a 35 y.o.male patient who comes for follow up he is ileostomy status.  Patient with loop ileostomy due to complex colostomy takedown.  He has been doing well.  Patient denies any pain.  He has been tolerating diet.  His ileostomy is working adequately.  Patient has a history of perforated diverticulitis.  He underwent robotic assisted laparoscopic Hartman's procedure on November 04, 2020.  There was complicated by intra-abdominal abscess that was treated with percutaneous drainage.  He eventually had colostomy takedown on June 08, 2021.   Today patient denies any chest pain, shortness of breath.  Patient able to meet 4 METS.  He feels great.      PAST MEDICAL HISTORY:  History reviewed. No pertinent past medical history.      PAST SURGICAL HISTORY:   Past Surgical History:  Procedure Laterality Date   ADENOIDECTOMY     COLON SURGERY  11/04/2020   Dr Arrie Senate -- ROBOTIC sigmoid colectomy   COLONOSCOPY  05/31/2021   Normal colon/Repeat 1yrs/[facility]   colostomy takedown  06/08/2021   Dr Arrie Senate --- ROBOTIC         MEDICATIONS:  Outpatient Encounter Medications as of 08/16/2021  Medication Sig Dispense Refill   oxyCODONE-acetaminophen (PERCOCET) 5-325 mg tablet Take 1 tablet by mouth every 6 (six) hours as needed for Pain 20 tablet 0   neomycin 500 mg tablet Take 2 tablets at 2 pm, 3 pm and 10 pm the day before surgery. (Patient not taking: Reported on 08/16/2021) 6 tablet 0   pantoprazole (PROTONIX) 40 MG DR tablet TAKE 1 TABLET (40 MG TOTAL) BY MOUTH ONCE DAILY TAKE 15-20 MINS BEFORE MEAL (Patient not taking: Reported on 04/07/2021) 90 tablet 2   No facility-administered encounter medications on file as of 08/16/2021.     ALLERGIES:   Patient has no known allergies.   SOCIAL HISTORY:  Social History   Socioeconomic History   Marital status: Married  Tobacco Use   Smoking status: Former    Types: Cigarettes     Quit date: 03/25/2013    Years since quitting: 8.4   Smokeless tobacco: Former    Quit date: 02/23/2006  Vaping Use   Vaping Use: Never used  Substance and Sexual Activity   Alcohol use: Yes    Comment: glass of wine occasionally   Drug use: Never    FAMILY HISTORY:  Family History  Problem Relation Age of Onset   Obesity Mother    Heart disease Father      GENERAL REVIEW OF SYSTEMS:   General ROS: negative for - chills, fatigue, fever, weight gain or weight loss Allergy and Immunology ROS: negative for - hives  Hematological and Lymphatic ROS: negative for - bleeding problems or bruising, negative for palpable nodes Endocrine ROS: negative for - heat or cold intolerance, hair changes Respiratory ROS: negative for - cough, shortness of breath or wheezing Cardiovascular ROS: no chest pain or palpitations GI ROS: negative for nausea, vomiting, abdominal pain, diarrhea, constipation Musculoskeletal ROS: negative for - joint swelling or muscle pain Neurological ROS: negative for - confusion, syncope Dermatological ROS: negative for pruritus and rash  PHYSICAL EXAM:  Vitals:   08/16/21 0924  BP: 133/79  Pulse: 102  .  Ht:193 cm (6\' 4" ) Wt:(!) 127.9 kg (282 lb) surface area is 2.62 meters squared. Body mass index is 34.33 kg/m.MBP:JPET   GENERAL: Alert, active, oriented x3  HEENT: Pupils equal reactive to  light. Extraocular movements are intact. Sclera clear. Palpebral conjunctiva normal red color.Pharynx clear.  NECK: Supple with no palpable mass and no adenopathy.  LUNGS: Sound clear with no rales rhonchi or wheezes.  HEART: Regular rhythm S1 and S2 without murmur.  ABDOMEN: Soft and depressible, nontender with no palpable mass, no hepatomegaly.  Ileostomy pink and patent.  EXTREMITIES: Well-developed well-nourished symmetrical with no dependent edema.  NEUROLOGICAL: Awake alert oriented, facial expression symmetrical, moving all extremities.      IMPRESSION:      Status post ileostomy (CMS-HCC) [Z93.2]   Patient with low ileostomy to protect colorectal anastomosis.  He has been doing well and recovering adequately.  I will order barium enema for assessment of patency of colorectal anastomosis.  She does endorses passing gas to the rectum.  I discussed with the patient the surgery of ileostomy takedown.  I discussed with patient the risk of bowel obstruction, infection, bleeding, intra-abdominal infection, prolonged ileus, among others.  He endorses he understood and agreed to proceed.  If brain MRI negative for any pathology, will proceed with ileostomy takedown.         PLAN:  Reversal of ileostomy (01751) CBC, CMP Avoid taking aspirin 5 days before procedure Barium enema study for evaluation of colorectal anastomosis Contact us if you have any concern.   Patient verbalized understanding, all questions were answered, and were agreeable with the plan outlined above.   Carolan Shiver, MD  Electronically signed by Carolan Shiver, MD

## 2021-08-16 NOTE — H&P (Signed)
HISTORY OF PRESENT ILLNESS:    Mark Spencer is a 35 y.o.male patient who comes for follow up he is ileostomy status.  Patient with loop ileostomy due to complex colostomy takedown.  He has been doing well.  Patient denies any pain.  He has been tolerating diet.  His ileostomy is working adequately.  Patient has a history of perforated diverticulitis.  He underwent robotic assisted laparoscopic Hartman's procedure on November 04, 2020.  There was complicated by intra-abdominal abscess that was treated with percutaneous drainage.  He eventually had colostomy takedown on June 08, 2021.   Today patient denies any chest pain, shortness of breath.  Patient able to meet 4 METS.  He feels great.      PAST MEDICAL HISTORY:  History reviewed. No pertinent past medical history.      PAST SURGICAL HISTORY:   Past Surgical History:  Procedure Laterality Date   ADENOIDECTOMY     COLON SURGERY  11/04/2020   Dr Arrie Senate -- ROBOTIC sigmoid colectomy   COLONOSCOPY  05/31/2021   Normal colon/Repeat 19yrs/[facility]   colostomy takedown  06/08/2021   Dr Arrie Senate --- ROBOTIC         MEDICATIONS:  Outpatient Encounter Medications as of 08/16/2021  Medication Sig Dispense Refill   oxyCODONE-acetaminophen (PERCOCET) 5-325 mg tablet Take 1 tablet by mouth every 6 (six) hours as needed for Pain 20 tablet 0   neomycin 500 mg tablet Take 2 tablets at 2 pm, 3 pm and 10 pm the day before surgery. (Patient not taking: Reported on 08/16/2021) 6 tablet 0   pantoprazole (PROTONIX) 40 MG DR tablet TAKE 1 TABLET (40 MG TOTAL) BY MOUTH ONCE DAILY TAKE 15-20 MINS BEFORE MEAL (Patient not taking: Reported on 04/07/2021) 90 tablet 2   No facility-administered encounter medications on file as of 08/16/2021.     ALLERGIES:   Patient has no known allergies.   SOCIAL HISTORY:  Social History   Socioeconomic History   Marital status: Married  Tobacco Use   Smoking status: Former    Types: Cigarettes     Quit date: 03/25/2013    Years since quitting: 8.4   Smokeless tobacco: Former    Quit date: 02/23/2006  Vaping Use   Vaping Use: Never used  Substance and Sexual Activity   Alcohol use: Yes    Comment: glass of wine occasionally   Drug use: Never    FAMILY HISTORY:  Family History  Problem Relation Age of Onset   Obesity Mother    Heart disease Father      GENERAL REVIEW OF SYSTEMS:   General ROS: negative for - chills, fatigue, fever, weight gain or weight loss Allergy and Immunology ROS: negative for - hives  Hematological and Lymphatic ROS: negative for - bleeding problems or bruising, negative for palpable nodes Endocrine ROS: negative for - heat or cold intolerance, hair changes Respiratory ROS: negative for - cough, shortness of breath or wheezing Cardiovascular ROS: no chest pain or palpitations GI ROS: negative for nausea, vomiting, abdominal pain, diarrhea, constipation Musculoskeletal ROS: negative for - joint swelling or muscle pain Neurological ROS: negative for - confusion, syncope Dermatological ROS: negative for pruritus and rash  PHYSICAL EXAM:  Vitals:   08/16/21 0924  BP: 133/79  Pulse: 102  .  Ht:193 cm (6\' 4" ) Wt:(!) 127.9 kg (282 lb) surface area is 2.62 meters squared. Body mass index is 34.33 kg/m.WUJ:WJXB   GENERAL: Alert, active, oriented x3  HEENT: Pupils equal reactive to  light. Extraocular movements are intact. Sclera clear. Palpebral conjunctiva normal red color.Pharynx clear.  NECK: Supple with no palpable mass and no adenopathy.  LUNGS: Sound clear with no rales rhonchi or wheezes.  HEART: Regular rhythm S1 and S2 without murmur.  ABDOMEN: Soft and depressible, nontender with no palpable mass, no hepatomegaly.  Ileostomy pink and patent.  EXTREMITIES: Well-developed well-nourished symmetrical with no dependent edema.  NEUROLOGICAL: Awake alert oriented, facial expression symmetrical, moving all extremities.      IMPRESSION:      Status post ileostomy (CMS-HCC) [Z93.2]   Patient with low ileostomy to protect colorectal anastomosis.  He has been doing well and recovering adequately.  I will order barium enema for assessment of patency of colorectal anastomosis.  She does endorses passing gas to the rectum.  I discussed with the patient the surgery of ileostomy takedown.  I discussed with patient the risk of bowel obstruction, infection, bleeding, intra-abdominal infection, prolonged ileus, among others.  He endorses he understood and agreed to proceed.  If brain MRI negative for any pathology, will proceed with ileostomy takedown.         PLAN:  Reversal of ileostomy (01751) CBC, CMP Avoid taking aspirin 5 days before procedure Barium enema study for evaluation of colorectal anastomosis Contact us if you have any concern.   Patient verbalized understanding, all questions were answered, and were agreeable with the plan outlined above.   Carolan Shiver, MD  Electronically signed by Carolan Shiver, MD

## 2021-08-24 ENCOUNTER — Other Ambulatory Visit: Payer: Self-pay

## 2021-08-24 ENCOUNTER — Ambulatory Visit
Admission: RE | Admit: 2021-08-24 | Discharge: 2021-08-24 | Disposition: A | Discharge status: Home / Self Care | Payer: BC Managed Care – PPO | Source: Ambulatory Visit | Attending: General Surgery | Admitting: General Surgery

## 2021-08-24 DIAGNOSIS — Z932 Ileostomy status: Secondary | ICD-10-CM | POA: Insufficient documentation

## 2021-08-24 MED ORDER — DIATRIZOATE MEGLUMINE & SODIUM 66-10 % PO SOLN
480.0000 mL | Freq: Once | ORAL | Status: AC
  Administered 2021-08-24: 480 mL
  Filled 2021-08-24: qty 480

## 2021-08-29 ENCOUNTER — Other Ambulatory Visit: Payer: Self-pay

## 2021-08-29 ENCOUNTER — Encounter
Admission: RE | Admit: 2021-08-29 | Discharge: 2021-08-29 | Disposition: A | Discharge status: Home / Self Care | Payer: BC Managed Care – PPO | Source: Ambulatory Visit | Attending: General Surgery | Admitting: General Surgery

## 2021-08-29 HISTORY — DX: Diverticulitis of intestine, part unspecified, with perforation and abscess without bleeding: K57.80

## 2021-08-29 NOTE — Patient Instructions (Addendum)
Your procedure is scheduled on: Monday 09/05/21 Report to the Registration Desk on the 1st floor of the [facility]. To find out your arrival time, please call [phone] between 1PM - 3PM on: Friday 09/02/21  REMEMBER: Instructions that are not followed completely may result in serious medical risk, up to and including death; or upon the discretion of your surgeon and anesthesiologist your surgery may need to be rescheduled.  Do not eat or drink after midnight the night before surgery.  No gum chewing, lozengers or hard candies.  TAKE THESE MEDICATIONS THE MORNING OF SURGERY WITH A SIP OF WATER: NONE  One week prior to surgery: Stop Anti-inflammatories (NSAIDS) such as Advil, Aleve, Ibuprofen, Motrin, Naproxen, Naprosyn and Aspirin based products such as Excedrin, Goodys Powder, BC Powder. Stop ANY OVER THE COUNTER supplements until after surgery. You may however, continue to take Tylenol if needed for pain up until the day of surgery.  No Alcohol for 24 hours before or after surgery.  No Smoking including e-cigarettes for 24 hours prior to surgery.  No chewable tobacco products for at least 6 hours prior to surgery.  No nicotine patches on the day of surgery.  Do not use any "recreational" drugs for at least a week prior to your surgery.  Please be advised that the combination of cocaine and anesthesia may have negative outcomes, up to and including death. If you test positive for cocaine, your surgery will be cancelled.  On the morning of surgery brush your teeth with toothpaste and water, you may rinse your mouth with mouthwash if you wish. Do not swallow any toothpaste or mouthwash.  Use Antibacterial Soap the night prior to surgery and then put on clean pajamas and a clean sheet on bed. The morning of surgery wash with the antibacterial soap.  Do not wear jewelry.  Do not wear lotions, powders, or cologne.   Do not shave body from the neck down 48 hours prior to surgery  just in case you cut yourself which could leave a site for infection.  Also, freshly shaved skin may become irritated if using the CHG soap.  Do not bring valuables to the hospital. [facility] is not responsible for any missing/lost belongings or valuables.   Notify your doctor if there is any change in your medical condition (cold, fever, infection).  Wear comfortable clothing (specific to your surgery type) to the hospital.  After surgery, you can help prevent lung complications by doing breathing exercises.  Take deep breaths and cough every 1-2 hours. Your doctor may order a device called an Incentive Spirometer to help you take deep breaths. When coughing or sneezing, hold a pillow firmly against your incision with both hands. This is called "splinting." Doing this helps protect your incision. It also decreases belly discomfort.  If you are being admitted to the hospital overnight, leave your suitcase in the car. After surgery it may be brought to your room.  If you are taking public transportation, you will need to have a responsible adult (18 years or older) with you. Please confirm with your physician that it is acceptable to use public transportation.   Please call the Pre-admissions Testing Dept. at [phone] if you have any questions about these instructions.  Surgery Visitation Policy:  Patients undergoing a surgery or procedure may have one family member or support person with them as long as that person is not COVID-19 positive or experiencing its symptoms.  That person may remain in the waiting  area during the procedure and may rotate out with other people.  Inpatient Visitation:    Visiting hours are 7 a.m. to 8 p.m. Up to two visitors ages 16+ are allowed at one time in a patient room. The visitors may rotate out with other people during the day. Visitors must check out when they leave, or other visitors will not be allowed. One designated support person may remain  overnight. The visitor must pass COVID-19 screenings, use hand sanitizer when entering and exiting the patient's room and wear a mask at all times, including in the patient's room. Patients must also wear a mask when staff or their visitor are in the room. Masking is required regardless of vaccination status.

## 2021-09-01 ENCOUNTER — Other Ambulatory Visit
Admission: RE | Admit: 2021-09-01 | Discharge: 2021-09-01 | Disposition: A | Discharge status: Home / Self Care | Payer: BC Managed Care – PPO | Source: Ambulatory Visit | Attending: General Surgery | Admitting: General Surgery

## 2021-09-01 DIAGNOSIS — Z20822 Contact with and (suspected) exposure to covid-19: Secondary | ICD-10-CM

## 2021-09-02 ENCOUNTER — Other Ambulatory Visit: Payer: Self-pay

## 2021-09-02 ENCOUNTER — Other Ambulatory Visit
Admission: RE | Admit: 2021-09-02 | Discharge: 2021-09-02 | Disposition: A | Discharge status: Home / Self Care | Payer: BC Managed Care – PPO | Source: Ambulatory Visit | Attending: General Surgery | Admitting: General Surgery

## 2021-09-02 DIAGNOSIS — Z20822 Contact with and (suspected) exposure to covid-19: Secondary | ICD-10-CM | POA: Insufficient documentation

## 2021-09-02 DIAGNOSIS — Z01812 Encounter for preprocedural laboratory examination: Secondary | ICD-10-CM | POA: Insufficient documentation

## 2021-09-02 LAB — SARS CORONAVIRUS 2 (TAT 6-24 HRS): SARS Coronavirus 2: NEGATIVE

## 2021-09-04 MED ORDER — BUPIVACAINE LIPOSOME 1.3 % IJ SUSP: 20.0000 mL | Freq: Once | INTRAMUSCULAR | Status: DC

## 2021-09-04 MED ORDER — CHLORHEXIDINE GLUCONATE 0.12 % MT SOLN: 15.0000 mL | Freq: Once | OROMUCOSAL | Status: AC

## 2021-09-04 MED ORDER — CEFAZOLIN SODIUM-DEXTROSE 2-4 GM/100ML-% IV SOLN
2.0000 g | INTRAVENOUS | Status: AC
  Administered 2021-09-05: 2 g via INTRAVENOUS

## 2021-09-04 MED ORDER — GABAPENTIN 300 MG PO CAPS: 300.0000 mg | ORAL_CAPSULE | ORAL | Status: AC

## 2021-09-04 MED ORDER — ORAL CARE MOUTH RINSE: 15.0000 mL | Freq: Once | OROMUCOSAL | Status: AC

## 2021-09-04 MED ORDER — FAMOTIDINE 20 MG PO TABS: 20.0000 mg | ORAL_TABLET | Freq: Once | ORAL | Status: AC

## 2021-09-04 MED ORDER — LACTATED RINGERS IV SOLN: INTRAVENOUS | Status: DC

## 2021-09-04 MED ORDER — ACETAMINOPHEN 500 MG PO TABS: 1000.0000 mg | ORAL_TABLET | ORAL | Status: AC

## 2021-09-04 MED ORDER — CELECOXIB 200 MG PO CAPS: 200.0000 mg | ORAL_CAPSULE | ORAL | Status: AC

## 2021-09-05 ENCOUNTER — Other Ambulatory Visit: Payer: Self-pay

## 2021-09-05 ENCOUNTER — Inpatient Hospital Stay
Admission: RE | Admit: 2021-09-05 | Discharge: 2021-09-07 | DRG: 331 | Disposition: A | Discharge status: Home / Self Care | Payer: BC Managed Care – PPO | Attending: General Surgery | Admitting: General Surgery

## 2021-09-05 ENCOUNTER — Encounter
Admission: RE | Disposition: A | Discharge status: Home / Self Care | Payer: Self-pay | Source: Ambulatory Visit | Attending: General Surgery

## 2021-09-05 ENCOUNTER — Encounter: Payer: Self-pay | Admitting: General Surgery

## 2021-09-05 ENCOUNTER — Inpatient Hospital Stay: Payer: BC Managed Care – PPO | Admitting: Certified Registered"

## 2021-09-05 DIAGNOSIS — Z8719 Personal history of other diseases of the digestive system: Secondary | ICD-10-CM

## 2021-09-05 DIAGNOSIS — K660 Peritoneal adhesions (postprocedural) (postinfection): Secondary | ICD-10-CM | POA: Diagnosis present

## 2021-09-05 DIAGNOSIS — Z20822 Contact with and (suspected) exposure to covid-19: Secondary | ICD-10-CM | POA: Diagnosis present

## 2021-09-05 DIAGNOSIS — Z932 Ileostomy status: Secondary | ICD-10-CM

## 2021-09-05 DIAGNOSIS — Z87891 Personal history of nicotine dependence: Secondary | ICD-10-CM | POA: Diagnosis not present

## 2021-09-05 DIAGNOSIS — Z8249 Family history of ischemic heart disease and other diseases of the circulatory system: Secondary | ICD-10-CM | POA: Diagnosis not present

## 2021-09-05 DIAGNOSIS — Z432 Encounter for attention to ileostomy: Principal | ICD-10-CM

## 2021-09-05 HISTORY — PX: ILEOSTOMY CLOSURE: SHX1784

## 2021-09-05 SURGERY — CLOSURE, ILEOSTOMY
Anesthesia: General | Site: Abdomen

## 2021-09-05 MED ORDER — HYDROMORPHONE HCL 1 MG/ML IJ SOLN
INTRAMUSCULAR | Status: AC
  Filled 2021-09-05: qty 1

## 2021-09-05 MED ORDER — ONDANSETRON HCL 4 MG/2ML IJ SOLN
4.0000 mg | Freq: Four times a day (QID) | INTRAMUSCULAR | Status: DC | PRN
  Administered 2021-09-05 – 2021-09-06 (×2): 4 mg via INTRAVENOUS
  Filled 2021-09-05 (×2): qty 2

## 2021-09-05 MED ORDER — FENTANYL CITRATE (PF) 100 MCG/2ML IJ SOLN
25.0000 ug | INTRAMUSCULAR | Status: DC | PRN
  Administered 2021-09-05: 50 ug via INTRAVENOUS
  Administered 2021-09-05: 25 ug via INTRAVENOUS

## 2021-09-05 MED ORDER — ONDANSETRON HCL 4 MG/2ML IJ SOLN
INTRAMUSCULAR | Status: AC
  Filled 2021-09-05: qty 2

## 2021-09-05 MED ORDER — ALUM & MAG HYDROXIDE-SIMETH 200-200-20 MG/5ML PO SUSP
30.0000 mL | ORAL | Status: DC | PRN
  Administered 2021-09-05: 30 mL via ORAL
  Filled 2021-09-05: qty 30

## 2021-09-05 MED ORDER — GABAPENTIN 300 MG PO CAPS
300.0000 mg | ORAL_CAPSULE | Freq: Two times a day (BID) | ORAL | Status: DC
  Administered 2021-09-05 – 2021-09-07 (×4): 300 mg via ORAL
  Filled 2021-09-05 (×4): qty 1

## 2021-09-05 MED ORDER — HYDROCODONE-ACETAMINOPHEN 5-325 MG PO TABS
1.0000 | ORAL_TABLET | ORAL | Status: DC | PRN
  Administered 2021-09-05 (×2): 1 via ORAL
  Administered 2021-09-06: 2 via ORAL
  Administered 2021-09-06: 1 via ORAL
  Administered 2021-09-06: 2 via ORAL
  Administered 2021-09-06: 1 via ORAL
  Administered 2021-09-06 – 2021-09-07 (×5): 2 via ORAL
  Filled 2021-09-05: qty 1
  Filled 2021-09-05: qty 2
  Filled 2021-09-05 (×4): qty 1
  Filled 2021-09-05 (×6): qty 2

## 2021-09-05 MED ORDER — ENOXAPARIN SODIUM 40 MG/0.4ML IJ SOSY
40.0000 mg | PREFILLED_SYRINGE | INTRAMUSCULAR | Status: DC
  Administered 2021-09-06 – 2021-09-07 (×2): 40 mg via SUBCUTANEOUS
  Filled 2021-09-05 (×2): qty 0.4

## 2021-09-05 MED ORDER — PROPOFOL 10 MG/ML IV BOLUS
INTRAVENOUS | Status: AC
  Filled 2021-09-05: qty 20

## 2021-09-05 MED ORDER — CELECOXIB 200 MG PO CAPS
200.0000 mg | ORAL_CAPSULE | Freq: Two times a day (BID) | ORAL | Status: DC
  Administered 2021-09-05 – 2021-09-07 (×4): 200 mg via ORAL
  Filled 2021-09-05 (×5): qty 1

## 2021-09-05 MED ORDER — BUPIVACAINE LIPOSOME 1.3 % IJ SUSP
INTRAMUSCULAR | Status: DC | PRN
  Administered 2021-09-05: 20 mL

## 2021-09-05 MED ORDER — DEXAMETHASONE SODIUM PHOSPHATE 10 MG/ML IJ SOLN
INTRAMUSCULAR | Status: AC
  Filled 2021-09-05: qty 1

## 2021-09-05 MED ORDER — BUPIVACAINE-EPINEPHRINE 0.25% -1:200000 IJ SOLN
INTRAMUSCULAR | Status: DC | PRN
  Administered 2021-09-05: 30 mL

## 2021-09-05 MED ORDER — PANTOPRAZOLE SODIUM 40 MG IV SOLR
40.0000 mg | Freq: Every day | INTRAVENOUS | Status: DC
  Administered 2021-09-05 – 2021-09-06 (×2): 40 mg via INTRAVENOUS
  Filled 2021-09-05 (×2): qty 40

## 2021-09-05 MED ORDER — KETAMINE HCL 50 MG/5ML IJ SOSY
PREFILLED_SYRINGE | INTRAMUSCULAR | Status: AC
  Filled 2021-09-05: qty 5

## 2021-09-05 MED ORDER — BUPIVACAINE-EPINEPHRINE (PF) 0.25% -1:200000 IJ SOLN
INTRAMUSCULAR | Status: AC
  Filled 2021-09-05: qty 30

## 2021-09-05 MED ORDER — GABAPENTIN 300 MG PO CAPS
ORAL_CAPSULE | ORAL | Status: AC
  Administered 2021-09-05: 300 mg via ORAL
  Filled 2021-09-05: qty 1

## 2021-09-05 MED ORDER — ROCURONIUM BROMIDE 100 MG/10ML IV SOLN
INTRAVENOUS | Status: DC | PRN
Start: 2021-09-05 — End: 2021-09-05
  Administered 2021-09-05: 10 mg via INTRAVENOUS
  Administered 2021-09-05: 20 mg via INTRAVENOUS
  Administered 2021-09-05: 10 mg via INTRAVENOUS
  Administered 2021-09-05: 50 mg via INTRAVENOUS
  Administered 2021-09-05: 20 mg via INTRAVENOUS

## 2021-09-05 MED ORDER — SODIUM CHLORIDE (PF) 0.9 % IJ SOLN
INTRAMUSCULAR | Status: AC
  Filled 2021-09-05: qty 50

## 2021-09-05 MED ORDER — ONDANSETRON HCL 4 MG/2ML IJ SOLN: 4.0000 mg | Freq: Four times a day (QID) | INTRAMUSCULAR | Status: DC | PRN

## 2021-09-05 MED ORDER — MIDAZOLAM HCL 2 MG/2ML IJ SOLN
INTRAMUSCULAR | Status: AC
  Filled 2021-09-05: qty 2

## 2021-09-05 MED ORDER — CELECOXIB 200 MG PO CAPS
ORAL_CAPSULE | ORAL | Status: AC
  Administered 2021-09-05: 200 mg via ORAL
  Filled 2021-09-05: qty 1

## 2021-09-05 MED ORDER — LIDOCAINE HCL (CARDIAC) PF 100 MG/5ML IV SOSY
PREFILLED_SYRINGE | INTRAVENOUS | Status: DC | PRN
  Administered 2021-09-05: 100 mg via INTRAVENOUS

## 2021-09-05 MED ORDER — FENTANYL CITRATE (PF) 100 MCG/2ML IJ SOLN
INTRAMUSCULAR | Status: AC
  Administered 2021-09-05: 25 ug via INTRAVENOUS
  Filled 2021-09-05: qty 2

## 2021-09-05 MED ORDER — MORPHINE SULFATE (PF) 4 MG/ML IV SOLN: 4.0000 mg | INTRAVENOUS | Status: DC | PRN

## 2021-09-05 MED ORDER — BUPIVACAINE LIPOSOME 1.3 % IJ SUSP
INTRAMUSCULAR | Status: AC
  Filled 2021-09-05: qty 20

## 2021-09-05 MED ORDER — ACETAMINOPHEN 500 MG PO TABS
ORAL_TABLET | ORAL | Status: AC
  Administered 2021-09-05: 1000 mg via ORAL
  Filled 2021-09-05: qty 2

## 2021-09-05 MED ORDER — SEVOFLURANE IN SOLN
RESPIRATORY_TRACT | Status: AC
  Filled 2021-09-05: qty 250

## 2021-09-05 MED ORDER — ONDANSETRON HCL 4 MG/2ML IJ SOLN
INTRAMUSCULAR | Status: DC | PRN
  Administered 2021-09-05: 4 mg via INTRAVENOUS

## 2021-09-05 MED ORDER — DROPERIDOL 2.5 MG/ML IJ SOLN
0.6250 mg | Freq: Once | INTRAMUSCULAR | Status: DC | PRN
  Filled 2021-09-05: qty 2

## 2021-09-05 MED ORDER — ACETAMINOPHEN 10 MG/ML IV SOLN: 1000.0000 mg | Freq: Once | INTRAVENOUS | Status: DC | PRN

## 2021-09-05 MED ORDER — FAMOTIDINE 20 MG PO TABS
ORAL_TABLET | ORAL | Status: AC
  Administered 2021-09-05: 20 mg via ORAL
  Filled 2021-09-05: qty 1

## 2021-09-05 MED ORDER — HYDROMORPHONE HCL 1 MG/ML IJ SOLN
INTRAMUSCULAR | Status: DC | PRN
Start: 2021-09-05 — End: 2021-09-05
  Administered 2021-09-05 (×2): 1 mg via INTRAVENOUS

## 2021-09-05 MED ORDER — MIDAZOLAM HCL 2 MG/2ML IJ SOLN
INTRAMUSCULAR | Status: DC | PRN
  Administered 2021-09-05: 2 mg via INTRAVENOUS

## 2021-09-05 MED ORDER — DEXAMETHASONE SODIUM PHOSPHATE 10 MG/ML IJ SOLN
INTRAMUSCULAR | Status: DC | PRN
  Administered 2021-09-05: 10 mg via INTRAVENOUS

## 2021-09-05 MED ORDER — CEFAZOLIN SODIUM-DEXTROSE 2-4 GM/100ML-% IV SOLN
INTRAVENOUS | Status: AC
  Filled 2021-09-05: qty 100

## 2021-09-05 MED ORDER — PROMETHAZINE HCL 25 MG/ML IJ SOLN: 6.2500 mg | INTRAMUSCULAR | Status: DC | PRN

## 2021-09-05 MED ORDER — DEXMEDETOMIDINE HCL IN NACL 400 MCG/100ML IV SOLN
INTRAVENOUS | Status: DC | PRN
  Administered 2021-09-05: 8 ug via INTRAVENOUS
  Administered 2021-09-05: 12 ug via INTRAVENOUS

## 2021-09-05 MED ORDER — SUGAMMADEX SODIUM 500 MG/5ML IV SOLN
INTRAVENOUS | Status: DC | PRN
  Administered 2021-09-05: 300 mg via INTRAVENOUS

## 2021-09-05 MED ORDER — SODIUM CHLORIDE 0.9 % IV SOLN: INTRAVENOUS | Status: DC

## 2021-09-05 MED ORDER — PROPOFOL 10 MG/ML IV BOLUS
INTRAVENOUS | Status: DC | PRN
  Administered 2021-09-05: 30 mg via INTRAVENOUS
  Administered 2021-09-05: 300 mg via INTRAVENOUS
  Administered 2021-09-05: 30 mg via INTRAVENOUS

## 2021-09-05 MED ORDER — OXYCODONE HCL 5 MG/5ML PO SOLN: 5.0000 mg | Freq: Once | ORAL | Status: DC | PRN

## 2021-09-05 MED ORDER — KETAMINE HCL 10 MG/ML IJ SOLN
INTRAMUSCULAR | Status: DC | PRN
  Administered 2021-09-05: 30 mg via INTRAVENOUS
  Administered 2021-09-05: 20 mg via INTRAVENOUS

## 2021-09-05 MED ORDER — OXYCODONE HCL 5 MG PO TABS: 5.0000 mg | ORAL_TABLET | Freq: Once | ORAL | Status: DC | PRN

## 2021-09-05 MED ORDER — CHLORHEXIDINE GLUCONATE 0.12 % MT SOLN
OROMUCOSAL | Status: AC
  Administered 2021-09-05: 15 mL via OROMUCOSAL
  Filled 2021-09-05: qty 15

## 2021-09-05 SURGICAL SUPPLY — 53 items
CHLORAPREP W/TINT 26 (MISCELLANEOUS) ×3 IMPLANT
DRAPE INCISE IOBAN 66X45 STRL (DRAPES) IMPLANT
DRAPE LAPAROTOMY 100X77 ABD (DRAPES) ×3 IMPLANT
DRAPE LEGGINS SURG 28X43 STRL (DRAPES) ×1 IMPLANT
DRAPE UNDER BUTTOCK W/FLU (DRAPES) ×1 IMPLANT
DRSG TELFA 3X8 NADH (GAUZE/BANDAGES/DRESSINGS) IMPLANT
ELECT BLADE 6 FLAT ULTRCLN (ELECTRODE) ×3 IMPLANT
ELECT REM PT RETURN 9FT ADLT (ELECTROSURGICAL) ×3
ELECTRODE REM PT RTRN 9FT ADLT (ELECTROSURGICAL) ×1 IMPLANT
GAUZE 4X4 16PLY ~~LOC~~+RFID DBL (SPONGE) ×3 IMPLANT
GLOVE SURG ENC MOIS LTX SZ6.5 (GLOVE) ×9 IMPLANT
GLOVE SURG SYN 6.5 ES PF (GLOVE) ×9 IMPLANT
GLOVE SURG SYN 6.5 PF PI (GLOVE) ×3 IMPLANT
GLOVE SURG UNDER POLY LF SZ6.5 (GLOVE) ×8 IMPLANT
GLOVE SURG UNDER POLY LF SZ7 (GLOVE) ×8 IMPLANT
GOWN STRL REUS W/ TWL LRG LVL3 (GOWN DISPOSABLE) ×6 IMPLANT
GOWN STRL REUS W/TWL LRG LVL3 (GOWN DISPOSABLE) ×12
KIT TURNOVER KIT A (KITS) ×3 IMPLANT
LABEL OR SOLS (LABEL) ×3 IMPLANT
LIGASURE IMPACT 36 18CM CVD LR (INSTRUMENTS) ×1 IMPLANT
MANIFOLD NEPTUNE II (INSTRUMENTS) ×3 IMPLANT
NS IRRIG 1000ML POUR BTL (IV SOLUTION) ×3 IMPLANT
PACK BASIN MAJOR ARMC (MISCELLANEOUS) ×3 IMPLANT
PAD DRESSING TELFA 3X8 NADH (GAUZE/BANDAGES/DRESSINGS) ×1 IMPLANT
PAD PREP 24X41 OB/GYN DISP (PERSONAL CARE ITEMS) ×1 IMPLANT
RELOAD BL CONTOUR (ENDOMECHANICALS) IMPLANT
RELOAD PROXIMATE 75MM BLUE (ENDOMECHANICALS) ×6 IMPLANT
RELOAD STAPLE 40 BLU REG (ENDOMECHANICALS) IMPLANT
RELOAD STAPLE 75 3.8 BLU REG (ENDOMECHANICALS) IMPLANT
SEALER TISSUE X1 CVD JAW (INSTRUMENTS) ×2 IMPLANT
SET YANKAUER POOLE SUCT (MISCELLANEOUS) ×3 IMPLANT
SPONGE KITTNER 5P (MISCELLANEOUS) ×1 IMPLANT
SPONGE T-LAP 18X18 ~~LOC~~+RFID (SPONGE) ×9 IMPLANT
STAPLER CIRCULAR MANUAL XL 29 (STAPLE) ×1 IMPLANT
STAPLER CVD CUT BL 40 RELOAD (ENDOMECHANICALS) IMPLANT
STAPLER CVD CUT BLU 40 RELOAD (ENDOMECHANICALS) IMPLANT
STAPLER PROXIMATE 75MM BLUE (STAPLE) ×4 IMPLANT
STAPLER SKIN PROX 35W (STAPLE) ×3 IMPLANT
SUT ETH BLK MONO 3 0 FS 1 12/B (SUTURE) ×1 IMPLANT
SUT PROLENE 0 CT 1 30 (SUTURE) ×4 IMPLANT
SUT SILK 3-0 (SUTURE) ×1 IMPLANT
SUT STRATAFIX 0 PDS+ CT-2 23 (SUTURE) ×6
SUT V-LOC 90 ABS 3-0 VLT  V-20 (SUTURE) ×2
SUT V-LOC 90 ABS 3-0 VLT V-20 (SUTURE) IMPLANT
SUT VIC AB 2-0 BRD 54 (SUTURE) ×1 IMPLANT
SUT VIC AB 3-0 54X BRD REEL (SUTURE) ×1 IMPLANT
SUT VIC AB 3-0 BRD 54 (SUTURE)
SUT VIC AB 3-0 SH 27 (SUTURE)
SUT VIC AB 3-0 SH 27X BRD (SUTURE) ×2 IMPLANT
SUTURE STRATFX 0 PDS+ CT-2 23 (SUTURE) IMPLANT
SYR BULB IRRIG 60ML STRL (SYRINGE) ×3 IMPLANT
TRAY FOLEY MTR SLVR 16FR STAT (SET/KITS/TRAYS/PACK) ×1 IMPLANT
WATER STERILE IRR 500ML POUR (IV SOLUTION) ×3 IMPLANT

## 2021-09-05 NOTE — Progress Notes (Signed)
Patient was nauseated earlier. Order received from Dr Maia Plan for zofran

## 2021-09-05 NOTE — Anesthesia Preprocedure Evaluation (Addendum)
Anesthesia Evaluation  Patient identified by MRN, date of birth, ID band Patient awake    Reviewed: Allergy & Precautions, NPO status , Patient's Chart, lab work & pertinent test results  Airway Mallampati: I  TM Distance: >3 FB Neck ROM: full    Dental no notable dental hx.    Pulmonary sleep apnea ,    Pulmonary exam normal        Cardiovascular negative cardio ROS Normal cardiovascular exam     Neuro/Psych negative neurological ROS  negative psych ROS   GI/Hepatic Neg liver ROS, status post ileostomy for diverticulitis   Endo/Other  negative endocrine ROS  Renal/GU      Musculoskeletal   Abdominal (+) + obese,   Peds  Hematology negative hematology ROS (+)   Anesthesia Other Findings No past medical history on file.  Past Surgical History: No date: COLECTOMY WITH COLOSTOMY CREATION/HARTMANN PROCEDURE No date: COLONOSCOPY 11/23/2020: IR RADIOLOGIST EVAL & MGMT 12/14/2020: IR RADIOLOGIST EVAL & MGMT     Reproductive/Obstetrics negative OB ROS                            Anesthesia Physical  Anesthesia Plan  ASA: 2  Anesthesia Plan: General ETT   Post-op Pain Management:    Induction: Intravenous  PONV Risk Score and Plan: Ondansetron, Dexamethasone, Midazolam and Treatment may vary due to age or medical condition  Airway Management Planned: Oral ETT  Additional Equipment:   Intra-op Plan:   Post-operative Plan: Extubation in OR  Informed Consent: I have reviewed the patients History and Physical, chart, labs and discussed the procedure including the risks, benefits and alternatives for the proposed anesthesia with the patient or authorized representative who has indicated his/her understanding and acceptance.     Dental advisory given  Plan Discussed with: CRNA and Anesthesiologist  Anesthesia Plan Comments: (Patient consented for risks of anesthesia including  but not limited to:  - adverse reactions to medications - damage to eyes, teeth, lips or other oral mucosa - nerve damage due to positioning  - sore throat or hoarseness - Damage to heart, brain, nerves, lungs, other parts of body or loss of life  Patient voiced understanding.)       Anesthesia Quick Evaluation

## 2021-09-05 NOTE — Anesthesia Postprocedure Evaluation (Signed)
Anesthesia Post Note  Patient: Mark Spencer  Procedure(s) Performed: ILEOSTOMY TAKEDOWN (Abdomen)  Patient location during evaluation: PACU Anesthesia Type: General Level of consciousness: awake and alert Pain management: pain level controlled Vital Signs Assessment: post-procedure vital signs reviewed and stable Respiratory status: spontaneous breathing, nonlabored ventilation and respiratory function stable Cardiovascular status: blood pressure returned to baseline and stable Postop Assessment: no apparent nausea or vomiting Anesthetic complications: no   No notable events documented.   Last Vitals:  Vitals:   09/05/21 1400 09/05/21 1415  BP: 130/80 135/82  Pulse: 83 71  Resp: 18 15  Temp:    SpO2: 94% 95%    Last Pain:  Vitals:   09/05/21 1423  TempSrc:   PainSc: 3                  Foye Deer

## 2021-09-05 NOTE — Anesthesia Procedure Notes (Signed)
Procedure Name: Intubation Date/Time: 09/05/2021 11:09 AM Performed by: Carter Kitten, CRNA Pre-anesthesia Checklist: Patient identified, Patient being monitored, Timeout performed, Emergency Drugs available and Suction available Patient Re-evaluated:Patient Re-evaluated prior to induction Oxygen Delivery Method: Circle system utilized Preoxygenation: Pre-oxygenation with 100% oxygen Induction Type: IV induction Ventilation: Mask ventilation without difficulty Laryngoscope Size: 4 and McGraph Grade View: Grade I Tube type: Oral Tube size: 7.0 mm Number of attempts: 1 Airway Equipment and Method: Stylet Placement Confirmation: ETT inserted through vocal cords under direct vision, positive ETCO2 and breath sounds checked- equal and bilateral Secured at: 22 cm Tube secured with: Tape Dental Injury: Teeth and Oropharynx as per pre-operative assessment

## 2021-09-05 NOTE — Op Note (Signed)
Preoperative diagnosis: Ileostomy status   Postoperative diagnosis: Ileostomy status.   Procedure: Closure of loop ileostomy.  Anesthesia: GETA  Surgeon: Dr. Hazle Quant, MD  Wound Classification: Contaminated  Indications:  Patient is a 35 y.o. male who underwent colostomy takedown with proximal diverting loop ileostomy 3 months ago. Contrast studies revealed a securely healed distal anastomosis.  Findings: 1.  Viable and healthy loop of small intestine proximal and distal. 2.  Adequate hemostasis  Description of procedure: The patient was placed in a supine position. The procedure was performed under general anesthesia. The ileostomy bag was removed, the ileostomy site cleaned, then the abdomen was prepped and draped in a sterile manner. A time-out was completed verifying correct patient, procedure, site, positioning, and implant(s) and/or special equipment prior to beginning this procedure. An elliptical skin incision about 2 mm from mucocutaneous junction, around the ileostomy was performed. The incision was deepened in the subcutaneous tissue until the serosa of the emerging bowel appeared. Sharp dissection was continued circumferentially in this plane, dividing the fine adhesions between the bowel and its mesentery and the subcutaneous fat until the fascia was reached, and the peritoneal cavity entered, after which it was feasible to bring the emerging ileal loop through the wound.  Proximal distal small bowel resected to anastomose new and fresh bowel edges.  The small intestine was divided with GIA.  Mesentery was divided with Enseal device.  Enterotomies were done in both proximal and distal loops.  GIA was inserted to do a side-to-side anastomosis.  The common enterotomy was closed with 3-0 V-Loc.  The mesenteric defect was approximated with 2-0 Vicryl.  The anastomosis was reinserted into the abdominal cavity. Hemostasis was secured, fascial defect was closed using continuous  0-STRATAFIX.  The skin was approximated with staples.  The patient tolerated the procedure well and was transferred to the postanesthesia care unit in stable condition.   Specimen: Ileostomy  Complications: None  Estimated Blood Loss: 50 mL

## 2021-09-05 NOTE — Transfer of Care (Signed)
Immediate Anesthesia Transfer of Care Note  Patient: Sulo Janczak  Procedure(s) Performed: ILEOSTOMY TAKEDOWN (Abdomen)  Patient Location: PACU  Anesthesia Type:General  Level of Consciousness: awake  Airway & Oxygen Therapy: Patient connected to face mask oxygen  Post-op Assessment: Report given to RN  Post vital signs: stable  Last Vitals:  Vitals Value Taken Time  BP    Temp    Pulse    Resp    SpO2      Last Pain:  Vitals:   09/05/21 0854  TempSrc: Oral  PainSc: 0-No pain         Complications: No notable events documented.

## 2021-09-05 NOTE — Progress Notes (Signed)
Patient arrived from PACU

## 2021-09-05 NOTE — Interval H&P Note (Signed)
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  The various methods of treatment have been discussed with the patient and family. After consideration of risks, benefits and other options for treatment, the patient has consented to  Procedure(s): ILEOSTOMY TAKEDOWN (N/A) as a surgical intervention.  The patient's history has been reviewed, patient examined, no change in status, stable for surgery.  I have reviewed the patient's chart and labs.  Questions were answered to the patient's satisfaction.     Carolan Shiver

## 2021-09-06 ENCOUNTER — Encounter: Payer: Self-pay | Admitting: General Surgery

## 2021-09-06 LAB — SURGICAL PATHOLOGY

## 2021-09-06 NOTE — Progress Notes (Signed)
Patient ID: Mark Spencer, male   DOB: October 28, 1985, 35 y.o.   MRN: [id number]     SURGICAL PROGRESS NOTE   Hospital Day(s): 1.   Interval History: Patient seen and examined, no acute events or new complaints overnight. Patient reports feeling well this morning.  Pain has been controlled with current pain management.  Denies nausea or vomiting.  Endorses tolerating the clear liquids.  Vital signs in last 24 hours: [min-max] current  Temp:  [97.4 F (36.3 C)-98.8 F (37.1 C)] 98.3 F (36.8 C) (12/13 0811) Pulse Rate:  [66-94] 66 (12/13 0811) Resp:  [13-19] 16 (12/13 0811) BP: (113-145)/(74-90) 113/76 (12/13 0811) SpO2:  [92 %-98 %] 98 % (12/13 0811)             Physical Exam:  Constitutional: alert, cooperative and no distress  Respiratory: breathing non-labored at rest  Cardiovascular: regular rate and sinus rhythm  Gastrointestinal: soft, non-tender, and non-distended.  Wound is dry and clean  Labs:  CBC Latest Ref Rng & Units 06/09/2021 11/12/2020 11/10/2020  WBC 4.0 - 10.5 K/uL 18.0(H) 14.7(H) 23.3(H)  Hemoglobin 13.0 - 17.0 g/dL 67.1 12.7(L) 13.5  Hematocrit 39.0 - 52.0 % 42.1 38.9(L) 39.2  Platelets 150 - 400 K/uL 243 291 261   CMP Latest Ref Rng & Units 06/09/2021 06/06/2021 11/07/2020  Glucose 70 - 99 mg/dL 245(Y) 099(I) 338(S)  BUN 6 - 20 mg/dL 14 12 19   Creatinine 0.61 - 1.24 mg/dL 5.05 3.97  Sodium 135 - 145 mmol/L 139 138 135  Potassium 3.5 - 5.1 mmol/L 3.8 3.7 3.8  Chloride 98 - 111 mmol/L 99 101 96(L)  CO2 22 - 32 mmol/L 30 27 28   Calcium 8.9 - 10.3 mg/dL 6.73) 9.1 )  Total Protein 6.5 - 8.1 g/dL - 8.0 -  Total Bilirubin 0.3 - 1.2 mg/dL - 0.8 -  Alkaline Phos 38 - 126 U/L - 62 -  AST 15 - 41 U/L - 20 -  ALT 0 - 44 U/L - 29 -    Imaging studies: No new pertinent imaging studies   Assessment/Plan:  35 y.o. male with history of low colostomy 1 Day Post-Op s/p loop ileostomy reversal.  Patient has been recovering adequately.  Vital sign has been stable.   No fever, no tachycardia.  Pain under control.  He tolerated clear liquids without nausea or vomiting or pain.  Will advance to full liquids.  I encouraged the patient to ambulate.  We will continue with pain management.  Local care given to the wound.  We will continue with DVT prophylaxis.  30, MD

## 2021-09-07 MED ORDER — OXYCODONE-ACETAMINOPHEN 5-325 MG PO TABS: 1.0000 | ORAL_TABLET | ORAL | 0 refills | Status: DC | PRN

## 2021-09-07 NOTE — Discharge Summary (Signed)
°  Patient ID: Mark Spencer MRN: [id number] DOB/AGE: Mar 17, 1986 35 y.o.  Admit date: 09/05/2021 Discharge date: 09/07/2021   Discharge Diagnoses:  Principal Problem:   Ileostomy status [facility])   Procedures: Ileostomy reversal  Hospital Course:   Consults: Patient with history of loop ileostomy.  He was admitted for loop ileostomy reversal.  He tolerated the procedure well.  He has been recovering adequately.  The pain is controlled.  Patient is ambulating.  Patient is tolerating diet.  Patient is having bowel movement and passing gas.  No issues with the wound.  Physical Exam HENT:     Head: Normocephalic.  Cardiovascular:     Rate and Rhythm: Normal rate and regular rhythm.     Pulses: Normal pulses.     Heart sounds: Normal heart sounds.  Pulmonary:     Effort: Pulmonary effort is normal.     Breath sounds: Normal breath sounds.  Abdominal:     General: Abdomen is flat. Bowel sounds are normal.     Palpations: Abdomen is soft.  Musculoskeletal:     Cervical back: Normal range of motion.  Neurological:     Mental Status: He is alert.  The wound is dry and clean.   Disposition: Discharge disposition: 01-Home or Self Care       Discharge Instructions     Diet - low sodium heart healthy   Complete by: As directed       Allergies as of 09/07/2021   No Known Allergies      Medication List     TAKE these medications    oxyCODONE-acetaminophen 5-325 MG tablet Commonly known as: Percocet Take 1 tablet by mouth every 4 (four) hours as needed for severe pain.        Follow-up Information     Carolan Shiver, MD Follow up on 09/14/2021.   Specialty: General Surgery Why: For suture removal at 8:00 am Contact information: [address] [place] [address] [phone]

## 2021-09-07 NOTE — Plan of Care (Signed)
Problem: Education: Goal: Knowledge of General Education information will improve Description: Including pain rating scale, medication(s)/side effects and non-pharmacologic comfort measures Outcome: Completed/Met   Problem: Clinical Measurements: Goal: Ability to maintain clinical measurements within normal limits will improve Outcome: Completed/Met Goal: Will remain free from infection Outcome: Completed/Met Goal: Diagnostic test results will improve Outcome: Completed/Met Goal: Respiratory complications will improve Outcome: Completed/Met Goal: Cardiovascular complication will be avoided Outcome: Completed/Met   Problem: Activity: Goal: Risk for activity intolerance will decrease Outcome: Completed/Met   Problem: Nutrition: Goal: Adequate nutrition will be maintained Outcome: Completed/Met   Problem: Coping: Goal: Level of anxiety will decrease Outcome: Completed/Met   Problem: Elimination: Goal: Will not experience complications related to bowel motility Outcome: Completed/Met Goal: Will not experience complications related to urinary retention Outcome: Completed/Met   Problem: Pain Managment: Goal: General experience of comfort will improve Outcome: Completed/Met   Problem: Safety: Goal: Ability to remain free from injury will improve Outcome: Completed/Met   Problem: Skin Integrity: Goal: Risk for impaired skin integrity will decrease Outcome: Completed/Met   

## 2021-09-07 NOTE — Discharge Instructions (Signed)

## 2021-09-07 NOTE — Progress Notes (Signed)
Patient discharged to home with belongings. Discharge supplies and instructions given. Patient has no questions or concerns. PIVX1 removed.

## 2021-09-07 NOTE — TOC CM/SW Note (Addendum)
°  Transition of Care [facility]) Screening Note   Patient Details  Name: Mark Spencer Date of Birth: 04/25/1986   Transition of Care [facility]) CM/SW Contact:    Margarito Liner, LCSW Phone Number: 09/07/2021, 8:19 AM    Transition of Care Department [facility]) has reviewed patient and no TOC needs have been identified at this time. We will continue to monitor patient advancement through interdisciplinary progression rounds. If new patient transition needs arise, please place a TOC consult.  10:47 am: Patient has orders to discharge home today. No TOC needs identified. CSW signing off.

## 2022-04-20 ENCOUNTER — Other Ambulatory Visit: Payer: Self-pay | Admitting: General Surgery

## 2022-04-20 DIAGNOSIS — K432 Incisional hernia without obstruction or gangrene: Secondary | ICD-10-CM

## 2022-04-27 ENCOUNTER — Ambulatory Visit: Payer: BC Managed Care – PPO

## 2022-05-04 ENCOUNTER — Ambulatory Visit
Admission: RE | Admit: 2022-05-04 | Discharge: 2022-05-04 | Disposition: A | Discharge status: Home / Self Care | Payer: BC Managed Care – PPO | Source: Ambulatory Visit | Attending: General Surgery | Admitting: General Surgery

## 2022-05-04 DIAGNOSIS — K432 Incisional hernia without obstruction or gangrene: Secondary | ICD-10-CM | POA: Insufficient documentation

## 2022-05-12 ENCOUNTER — Ambulatory Visit: Payer: Self-pay | Admitting: General Surgery

## 2022-05-12 NOTE — H&P (Signed)
HISTORY OF PRESENT ILLNESS:    [name] is a 36 y.o.male patient who comes for follow up for evaluation of CT scan images and coordination of surgery.  Patient presented with bulging and pain in the left abdominal wall.  Patient had history of complicated diverticulitis with Hartmann procedure and eventual reversal of colostomy.  He now has a bulge in the left lower quadrant incision and in the midline incision.  He endorses pain with protrusion of the bulge.  Pain localized to the abdominal wall.  No pain radiation.  Aggravating factor is resting.  Aggravating factor is applying pressure to the bulge.  Denies any episode of abdominal distention, nausea or vomiting.  Patient tolerating diet.  Patient passing gas.  He had a CT scan of the abdomen and pelvis that shows an incisional hernia in the left lower quadrant colostomy incision and a small incisional hernia in the midline incision.  This hernia is are 11 cm apart.  I personally evaluated the images of the CT scan.  No sign of obstruction.      PAST MEDICAL HISTORY:  History reviewed. No pertinent past medical history.      PAST SURGICAL HISTORY:   Past Surgical History:  Procedure Laterality Date   COLON SURGERY  11/04/2020   Dr Arrie Senate -- ROBOTIC sigmoid colectomy   COLONOSCOPY  05/31/2021   Normal colon/Repeat 51yrs/[place]   colostomy takedown  06/08/2021   Dr Arrie Senate --- ROBOTIC   LAPAROSCOPIC ILEOSTOMY TAKEDOWN  09/05/2021   Dr Arrie Senate   ADENOIDECTOMY           MEDICATIONS:  Outpatient Encounter Medications as of 05/09/2022  Medication Sig Dispense Refill   neomycin 500 mg tablet Take 2 tablets at 2 pm, 3 pm and 10 pm the day before surgery. (Patient not taking: Reported on 08/16/2021) 6 tablet 0   oxyCODONE-acetaminophen (PERCOCET) 5-325 mg tablet Take 1 tablet by mouth every 6 (six) hours as needed for Pain (Patient not taking: Reported on 09/29/2021) 20 tablet 0   pantoprazole (PROTONIX) 40 MG DR  tablet TAKE 1 TABLET (40 MG TOTAL) BY MOUTH ONCE DAILY TAKE 15-20 MINS BEFORE MEAL (Patient not taking: Reported on 04/07/2021) 90 tablet 2   No facility-administered encounter medications on file as of 05/09/2022.     ALLERGIES:   Patient has no known allergies.   SOCIAL HISTORY:  Social History   Socioeconomic History   Marital status: Married  Tobacco Use   Smoking status: Former    Types: Cigarettes    Quit date: 03/25/2013    Years since quitting: 9.1   Smokeless tobacco: Former    Quit date: 02/23/2006  Vaping Use   Vaping Use: Never used  Substance and Sexual Activity   Alcohol use: Yes    Comment: glass of wine occasionally   Drug use: Never    FAMILY HISTORY:  Family History  Problem Relation Age of Onset   Obesity Mother    Heart disease Father      GENERAL REVIEW OF SYSTEMS:   General ROS: negative for - chills, fatigue, fever, weight gain or weight loss Allergy and Immunology ROS: negative for - hives  Hematological and Lymphatic ROS: negative for - bleeding problems or bruising, negative for palpable nodes Endocrine ROS: negative for - heat or cold intolerance, hair changes Respiratory ROS: negative for - cough, shortness of breath or wheezing Cardiovascular ROS: no chest pain or palpitations GI ROS: negative for nausea, vomiting, abdominal pain, diarrhea,  constipation Musculoskeletal ROS: negative for - joint swelling or muscle pain Neurological ROS: negative for - confusion, syncope Dermatological ROS: negative for pruritus and rash  PHYSICAL EXAM:  Vitals:   05/09/22 1129  BP: 129/70  Pulse: 102  .  Ht:193 cm (6\' 4" ) Wt:(!) 127.9 kg (282 lb) surface area is 2.62 meters squared. Body mass index is 34.33 kg/m.QAS:TMHD   GENERAL: Alert, active, oriented x3  HEENT: Pupils equal reactive to light. Extraocular movements are intact. Sclera clear. Palpebral conjunctiva normal red color.Pharynx clear.  NECK: Supple with no palpable mass and no  adenopathy.  LUNGS: Sound clear with no rales rhonchi or wheezes.  HEART: Regular rhythm S1 and S2 without murmur.  ABDOMEN: Soft and depressible, nontender with no palpable mass, no hepatomegaly.  Palpable bulge in the left lower quadrant and midline.  Left lower quadrant hernia is incarcerated.  The midline incision hernia is reducible.  EXTREMITIES: Well-developed well-nourished symmetrical with no dependent edema.  NEUROLOGICAL: Awake alert oriented, facial expression symmetrical, moving all extremities.      IMPRESSION:     Incisional hernia, without obstruction or gangrene [K43.2]    Patient with incisional hernia.  There are 2 incisional hernia wanting the left lower quadrant colostomy incision and one in the mid midline close to the periumbilical area.  This hernia measures about 3 to 4 cm with 11 cm apart.  The left lower quadrant incision is incarcerated.  There is no bowel involved.  There is no sign of obstruction.  I discussed with patient recommendation of repair of the incisional hernias.  We discussed about minimally invasive and open approach and possible combination of both approach.  We discussed the with the patient about risk of surgery that includes bleeding, infection, injury to intestine, intestinal fistula, intra-abdominal infection, pain, and other complications.  The patient report he understood and would like to proceed with repair of incisional hernia.       PLAN:  1.  Robotic assist laparoscopic incisional hernia repair [phone]) 2.  Avoid taking aspirin 5 days before surgery 3.  Contact ([phone] if you have any concern  Patient verbalized understanding, all questions were answered, and were agreeable with the plan outlined above.   [place], MD  Electronically signed by Carolan Shiver, MD

## 2022-06-01 ENCOUNTER — Encounter
Admission: RE | Admit: 2022-06-01 | Discharge: 2022-06-01 | Disposition: A | Discharge status: Home / Self Care | Payer: BC Managed Care – PPO | Source: Ambulatory Visit | Attending: General Surgery | Admitting: General Surgery

## 2022-06-01 HISTORY — DX: Other specified postprocedural states: Z98.890

## 2022-06-01 NOTE — Patient Instructions (Signed)
Your procedure is scheduled on:06-12-22 Monday Report to the Registration Desk on the 1st floor of the Medical Mall.Then proceed to the 2nd floor Surgery Desk To find out your arrival time, please call [phone] between 1PM - 3PM on:06-09-22 Friday If your arrival time is 6:00 am, do not arrive prior to that time as the [facility] Mall entrance doors do not open until 6:00 am.  REMEMBER: Instructions that are not followed completely may result in serious medical risk, up to and including death; or upon the discretion of your surgeon and anesthesiologist your surgery may need to be rescheduled.  Do not eat food OR drink any liquids after midnight the night before surgery.  No gum chewing, lozengers or hard candies.   Do NOT take any medication the day of surgery  One week prior to surgery: Stop Anti-inflammatories (NSAIDS) such as Advil, Aleve, Ibuprofen, Motrin, Naproxen, Naprosyn and Aspirin based products such as Excedrin, Goodys Powder, BC Powder.You may however, take Tylenol if needed for pain up until the day of surgery.  Stop ANY OVER THE COUNTER supplements/vitamins 7 days prior to surgery  No Alcohol for 24 hours before or after surgery.  No Smoking including e-cigarettes for 24 hours prior to surgery.  No chewable tobacco products for at least 6 hours prior to surgery.  No nicotine patches on the day of surgery.  Do not use any "recreational" drugs for at least a week prior to your surgery.  Please be advised that the combination of cocaine and anesthesia may have negative outcomes, up to and including death. If you test positive for cocaine, your surgery will be cancelled.  On the morning of surgery brush your teeth with toothpaste and water, you may rinse your mouth with mouthwash if you wish. Do not swallow any toothpaste or mouthwash.  Use CHG Soap as directed on instruction sheet.  Do not wear jewelry, make-up, hairpins, clips or nail polish.  Do not wear lotions,  powders, or perfumes.   Do not shave body from the neck down 48 hours prior to surgery just in case you cut yourself which could leave a site for infection.  Also, freshly shaved skin may become irritated if using the CHG soap.  Contact lenses, hearing aids and dentures may not be worn into surgery.  Do not bring valuables to the hospital. [facility] is not responsible for any missing/lost belongings or valuables.   Notify your doctor if there is any change in your medical condition (cold, fever, infection).  Wear comfortable clothing (specific to your surgery type) to the hospital.  After surgery, you can help prevent lung complications by doing breathing exercises.  Take deep breaths and cough every 1-2 hours. Your doctor may order a device called an Incentive Spirometer to help you take deep breaths. When coughing or sneezing, hold a pillow firmly against your incision with both hands. This is called "splinting." Doing this helps protect your incision. It also decreases belly discomfort.  If you are being admitted to the hospital overnight, leave your suitcase in the car. After surgery it may be brought to your room.  If you are being discharged the day of surgery, you will not be allowed to drive home. You will need a responsible adult (18 years or older) to drive you home and stay with you that night.   If you are taking public transportation, you will need to have a responsible adult (18 years or older) with you. Please confirm with your physician that  it is acceptable to use public transportation.   Please call the [facility] at [phone] if you have any questions about these instructions.  Surgery Visitation Policy:  Patients undergoing a surgery or procedure may have two family members or support persons with them as long as the person is not COVID-19 positive or experiencing its symptoms.

## 2022-06-11 MED ORDER — LACTATED RINGERS IV SOLN: INTRAVENOUS | Status: DC

## 2022-06-11 MED ORDER — GABAPENTIN 300 MG PO CAPS: 300.0000 mg | ORAL_CAPSULE | ORAL | Status: AC

## 2022-06-11 MED ORDER — BUPIVACAINE LIPOSOME 1.3 % IJ SUSP: 20.0000 mL | Freq: Once | INTRAMUSCULAR | Status: DC

## 2022-06-11 MED ORDER — CHLORHEXIDINE GLUCONATE 0.12 % MT SOLN: 15.0000 mL | Freq: Once | OROMUCOSAL | Status: AC

## 2022-06-11 MED ORDER — CEFAZOLIN SODIUM-DEXTROSE 2-4 GM/100ML-% IV SOLN
2.0000 g | INTRAVENOUS | Status: AC
  Administered 2022-06-12: 2 g via INTRAVENOUS

## 2022-06-11 MED ORDER — ORAL CARE MOUTH RINSE: 15.0000 mL | Freq: Once | OROMUCOSAL | Status: AC

## 2022-06-11 MED ORDER — ACETAMINOPHEN 500 MG PO TABS: 1000.0000 mg | ORAL_TABLET | ORAL | Status: AC

## 2022-06-11 MED ORDER — CELECOXIB 200 MG PO CAPS: 200.0000 mg | ORAL_CAPSULE | ORAL | Status: AC

## 2022-06-11 MED ORDER — FAMOTIDINE 20 MG PO TABS: 20.0000 mg | ORAL_TABLET | Freq: Once | ORAL | Status: AC

## 2022-06-12 ENCOUNTER — Ambulatory Visit: Payer: BC Managed Care – PPO | Admitting: Certified Registered Nurse Anesthetist

## 2022-06-12 ENCOUNTER — Other Ambulatory Visit: Payer: Self-pay

## 2022-06-12 ENCOUNTER — Encounter: Payer: Self-pay | Admitting: General Surgery

## 2022-06-12 ENCOUNTER — Ambulatory Visit
Admission: RE | Admit: 2022-06-12 | Discharge: 2022-06-12 | Disposition: A | Discharge status: Home / Self Care | Payer: BC Managed Care – PPO | Attending: General Surgery | Admitting: General Surgery

## 2022-06-12 ENCOUNTER — Encounter
Admission: RE | Disposition: A | Discharge status: Home / Self Care | Payer: Self-pay | Source: Home / Self Care | Attending: General Surgery

## 2022-06-12 DIAGNOSIS — K432 Incisional hernia without obstruction or gangrene: Secondary | ICD-10-CM | POA: Diagnosis present

## 2022-06-12 DIAGNOSIS — K430 Incisional hernia with obstruction, without gangrene: Secondary | ICD-10-CM | POA: Insufficient documentation

## 2022-06-12 DIAGNOSIS — K660 Peritoneal adhesions (postprocedural) (postinfection): Secondary | ICD-10-CM | POA: Diagnosis not present

## 2022-06-12 DIAGNOSIS — E669 Obesity, unspecified: Secondary | ICD-10-CM | POA: Diagnosis not present

## 2022-06-12 DIAGNOSIS — Z87891 Personal history of nicotine dependence: Secondary | ICD-10-CM | POA: Insufficient documentation

## 2022-06-12 DIAGNOSIS — Z6834 Body mass index (BMI) 34.0-34.9, adult: Secondary | ICD-10-CM | POA: Diagnosis not present

## 2022-06-12 DIAGNOSIS — Z933 Colostomy status: Secondary | ICD-10-CM | POA: Diagnosis not present

## 2022-06-12 HISTORY — PX: XI ROBOTIC ASSISTED VENTRAL HERNIA: SHX6789

## 2022-06-12 HISTORY — PX: INSERTION OF MESH: SHX5868

## 2022-06-12 SURGERY — REPAIR, HERNIA, VENTRAL, ROBOT-ASSISTED
Anesthesia: General | Site: Abdomen

## 2022-06-12 MED ORDER — DEXAMETHASONE SODIUM PHOSPHATE 10 MG/ML IJ SOLN
INTRAMUSCULAR | Status: DC | PRN
  Administered 2022-06-12: 10 mg via INTRAVENOUS

## 2022-06-12 MED ORDER — ONDANSETRON HCL 4 MG/2ML IJ SOLN
INTRAMUSCULAR | Status: AC
  Filled 2022-06-12: qty 2

## 2022-06-12 MED ORDER — MIDAZOLAM HCL 2 MG/2ML IJ SOLN
INTRAMUSCULAR | Status: DC | PRN
  Administered 2022-06-12: 2 mg via INTRAVENOUS

## 2022-06-12 MED ORDER — FAMOTIDINE 20 MG PO TABS
ORAL_TABLET | ORAL | Status: AC
  Administered 2022-06-12: 20 mg via ORAL
  Filled 2022-06-12: qty 1

## 2022-06-12 MED ORDER — OXYBUTYNIN CHLORIDE 5 MG PO TABS
ORAL_TABLET | ORAL | Status: AC
  Administered 2022-06-12: 5 mg
  Filled 2022-06-12: qty 1

## 2022-06-12 MED ORDER — MIDAZOLAM HCL 2 MG/2ML IJ SOLN
INTRAMUSCULAR | Status: AC
  Filled 2022-06-12: qty 2

## 2022-06-12 MED ORDER — HYDROMORPHONE HCL 1 MG/ML IJ SOLN
0.2500 mg | INTRAMUSCULAR | Status: DC | PRN
  Administered 2022-06-12 (×4): 0.5 mg via INTRAVENOUS

## 2022-06-12 MED ORDER — CEFAZOLIN SODIUM-DEXTROSE 2-4 GM/100ML-% IV SOLN
INTRAVENOUS | Status: AC
  Filled 2022-06-12: qty 100

## 2022-06-12 MED ORDER — SUCCINYLCHOLINE CHLORIDE 200 MG/10ML IV SOSY
PREFILLED_SYRINGE | INTRAVENOUS | Status: DC | PRN
  Administered 2022-06-12: 120 mg via INTRAVENOUS

## 2022-06-12 MED ORDER — PROPOFOL 10 MG/ML IV BOLUS
INTRAVENOUS | Status: AC
  Filled 2022-06-12: qty 40

## 2022-06-12 MED ORDER — BUPIVACAINE-EPINEPHRINE (PF) 0.25% -1:200000 IJ SOLN
INTRAMUSCULAR | Status: AC
  Filled 2022-06-12: qty 30

## 2022-06-12 MED ORDER — FENTANYL CITRATE (PF) 100 MCG/2ML IJ SOLN
INTRAMUSCULAR | Status: DC | PRN
  Administered 2022-06-12 (×2): 50 ug via INTRAVENOUS
  Administered 2022-06-12: 100 ug via INTRAVENOUS

## 2022-06-12 MED ORDER — FENTANYL CITRATE (PF) 100 MCG/2ML IJ SOLN
INTRAMUSCULAR | Status: AC
  Filled 2022-06-12: qty 2

## 2022-06-12 MED ORDER — ACETAMINOPHEN 500 MG PO TABS
ORAL_TABLET | ORAL | Status: AC
  Administered 2022-06-12: 1000 mg via ORAL
  Filled 2022-06-12: qty 2

## 2022-06-12 MED ORDER — HYDROMORPHONE HCL 1 MG/ML IJ SOLN
INTRAMUSCULAR | Status: AC
  Filled 2022-06-12: qty 1

## 2022-06-12 MED ORDER — ONDANSETRON HCL 4 MG/2ML IJ SOLN
4.0000 mg | Freq: Once | INTRAMUSCULAR | Status: AC
  Administered 2022-06-12: 4 mg via INTRAVENOUS

## 2022-06-12 MED ORDER — ONDANSETRON HCL 4 MG/2ML IJ SOLN
INTRAMUSCULAR | Status: DC | PRN
  Administered 2022-06-12: 4 mg via INTRAVENOUS

## 2022-06-12 MED ORDER — ROCURONIUM BROMIDE 100 MG/10ML IV SOLN
INTRAVENOUS | Status: DC | PRN
  Administered 2022-06-12: 20 mg via INTRAVENOUS
  Administered 2022-06-12: 50 mg via INTRAVENOUS
  Administered 2022-06-12: 20 mg via INTRAVENOUS
  Administered 2022-06-12: 30 mg via INTRAVENOUS
  Administered 2022-06-12: 20 mg via INTRAVENOUS

## 2022-06-12 MED ORDER — SUGAMMADEX SODIUM 200 MG/2ML IV SOLN
INTRAVENOUS | Status: DC | PRN
  Administered 2022-06-12: 200 mg via INTRAVENOUS

## 2022-06-12 MED ORDER — BUPIVACAINE LIPOSOME 1.3 % IJ SUSP
INTRAMUSCULAR | Status: DC | PRN
  Administered 2022-06-12: 20 mL

## 2022-06-12 MED ORDER — CHLORHEXIDINE GLUCONATE 0.12 % MT SOLN
OROMUCOSAL | Status: AC
  Administered 2022-06-12: 15 mL via OROMUCOSAL
  Filled 2022-06-12: qty 15

## 2022-06-12 MED ORDER — PROPOFOL 10 MG/ML IV BOLUS
INTRAVENOUS | Status: DC | PRN
  Administered 2022-06-12: 200 mg via INTRAVENOUS

## 2022-06-12 MED ORDER — OXYCODONE HCL 5 MG PO TABS
5.0000 mg | ORAL_TABLET | Freq: Once | ORAL | Status: AC | PRN
  Administered 2022-06-12: 5 mg via ORAL

## 2022-06-12 MED ORDER — DEXMEDETOMIDINE HCL IN NACL 80 MCG/20ML IV SOLN
INTRAVENOUS | Status: DC | PRN
  Administered 2022-06-12: 12 ug via BUCCAL

## 2022-06-12 MED ORDER — OXYCODONE HCL 5 MG/5ML PO SOLN: 5.0000 mg | Freq: Once | ORAL | Status: AC | PRN

## 2022-06-12 MED ORDER — OXYCODONE HCL 5 MG PO TABS
ORAL_TABLET | ORAL | Status: AC
  Filled 2022-06-12: qty 1

## 2022-06-12 MED ORDER — CELECOXIB 200 MG PO CAPS
ORAL_CAPSULE | ORAL | Status: AC
  Administered 2022-06-12: 200 mg via ORAL
  Filled 2022-06-12: qty 1

## 2022-06-12 MED ORDER — BUPIVACAINE-EPINEPHRINE (PF) 0.25% -1:200000 IJ SOLN
INTRAMUSCULAR | Status: DC | PRN
  Administered 2022-06-12: 30 mL

## 2022-06-12 MED ORDER — GLYCOPYRROLATE 0.2 MG/ML IJ SOLN
INTRAMUSCULAR | Status: DC | PRN
  Administered 2022-06-12: .2 mg via INTRAVENOUS

## 2022-06-12 MED ORDER — LIDOCAINE HCL (CARDIAC) PF 100 MG/5ML IV SOSY
PREFILLED_SYRINGE | INTRAVENOUS | Status: DC | PRN
  Administered 2022-06-12: 100 mg via INTRAVENOUS

## 2022-06-12 MED ORDER — BUPIVACAINE LIPOSOME 1.3 % IJ SUSP
INTRAMUSCULAR | Status: AC
  Filled 2022-06-12: qty 20

## 2022-06-12 MED ORDER — OXYCODONE HCL 5 MG PO TABS
5.0000 mg | ORAL_TABLET | Freq: Once | ORAL | Status: AC
  Administered 2022-06-12: 5 mg via ORAL

## 2022-06-12 MED ORDER — OXYCODONE-ACETAMINOPHEN 5-325 MG PO TABS: 1.0000 | ORAL_TABLET | ORAL | 0 refills | Status: AC | PRN

## 2022-06-12 MED ORDER — GABAPENTIN 300 MG PO CAPS
ORAL_CAPSULE | ORAL | Status: AC
  Administered 2022-06-12: 300 mg via ORAL
  Filled 2022-06-12: qty 1

## 2022-06-12 SURGICAL SUPPLY — 54 items
ADH SKN CLS APL DERMABOND .7 (GAUZE/BANDAGES/DRESSINGS) ×2
BAG PRESSURE INF REUSE 1000 (BAG) IMPLANT
COVER TIP SHEARS 8 DVNC (MISCELLANEOUS) ×2 IMPLANT
COVER TIP SHEARS 8MM DA VINCI (MISCELLANEOUS) ×2
COVER WAND RF STERILE (DRAPES) ×2 IMPLANT
DERMABOND ADVANCED .7 DNX12 (GAUZE/BANDAGES/DRESSINGS) ×2 IMPLANT
DRAPE ARM DVNC X/XI (DISPOSABLE) ×6 IMPLANT
DRAPE COLUMN DVNC XI (DISPOSABLE) ×2 IMPLANT
DRAPE DA VINCI XI ARM (DISPOSABLE) ×6
DRAPE DA VINCI XI COLUMN (DISPOSABLE) ×2
ELECT REM PT RETURN 9FT ADLT (ELECTROSURGICAL) ×2
ELECTRODE REM PT RTRN 9FT ADLT (ELECTROSURGICAL) ×2 IMPLANT
GLOVE BIO SURGEON STRL SZ 6.5 (GLOVE) ×4 IMPLANT
GLOVE BIOGEL PI IND STRL 6.5 (GLOVE) ×4 IMPLANT
GOWN STRL REUS W/ TWL LRG LVL3 (GOWN DISPOSABLE) ×6 IMPLANT
GOWN STRL REUS W/TWL LRG LVL3 (GOWN DISPOSABLE) ×6
IRRIGATOR SUCT 8 DISP DVNC XI (IRRIGATION / IRRIGATOR) IMPLANT
IRRIGATOR SUCTION 8MM XI DISP (IRRIGATION / IRRIGATOR)
IV NS 1000ML (IV SOLUTION)
IV NS 1000ML BAXH (IV SOLUTION) IMPLANT
KIT PINK PAD W/HEAD ARE REST (MISCELLANEOUS) ×2
KIT PINK PAD W/HEAD ARM REST (MISCELLANEOUS) ×2 IMPLANT
LABEL OR SOLS (LABEL) ×2 IMPLANT
MANIFOLD NEPTUNE II (INSTRUMENTS) ×2 IMPLANT
MESH VENTRALIGHT ST 4.5IN (Mesh General) IMPLANT
MESH VENTRALIGHT ST 6X8 (Mesh Specialty) ×2 IMPLANT
MESH VENTRLGHT ELLIPSE 8X6XMFL (Mesh Specialty) IMPLANT
NDL INSUFFLATION 14GA 120MM (NEEDLE) ×2 IMPLANT
NEEDLE HYPO 22GX1.5 SAFETY (NEEDLE) ×2 IMPLANT
NEEDLE INSUFFLATION 14GA 120MM (NEEDLE) ×2 IMPLANT
OBTURATOR OPTICAL STANDARD 8MM (TROCAR) ×2
OBTURATOR OPTICAL STND 8 DVNC (TROCAR) ×2
OBTURATOR OPTICALSTD 8 DVNC (TROCAR) ×2 IMPLANT
PACK LAP CHOLECYSTECTOMY (MISCELLANEOUS) ×2 IMPLANT
PENCIL SMOKE EVACUATOR (MISCELLANEOUS) IMPLANT
SEAL CANN UNIV 5-8 DVNC XI (MISCELLANEOUS) ×6 IMPLANT
SEAL XI 5MM-8MM UNIVERSAL (MISCELLANEOUS) ×6
SET TUBE SMOKE EVAC HIGH FLOW (TUBING) ×2 IMPLANT
SOLUTION ELECTROLUBE (MISCELLANEOUS) ×2 IMPLANT
STAPLER CANNULA SEAL DVNC XI (STAPLE) IMPLANT
STAPLER CANNULA SEAL XI (STAPLE) ×2
SUT MNCRL AB 4-0 PS2 18 (SUTURE) ×2 IMPLANT
SUT PROLENE 2 0 FS (SUTURE) IMPLANT
SUT STRATAFIX PDS 30 CT-1 (SUTURE) ×2 IMPLANT
SUT V-LOC 90 ABS 3-0 VLT  V-20 (SUTURE) ×8
SUT V-LOC 90 ABS 3-0 VLT V-20 (SUTURE) IMPLANT
SUT VIC AB 2-0 SH 27 (SUTURE) ×4
SUT VIC AB 2-0 SH 27XBRD (SUTURE) IMPLANT
SYS TROCAR 1.5-3 SLV ABD GEL (ENDOMECHANICALS) ×2
SYSTEM TROCR 1.5-3 SLV ABD GEL (ENDOMECHANICALS) IMPLANT
TAPE TRANSPORE STRL 2 31045 (GAUZE/BANDAGES/DRESSINGS) ×2 IMPLANT
TRAP FLUID SMOKE EVACUATOR (MISCELLANEOUS) ×2 IMPLANT
TRAY FOLEY MTR SLVR 16FR STAT (SET/KITS/TRAYS/PACK) IMPLANT
WATER STERILE IRR 500ML POUR (IV SOLUTION) ×2 IMPLANT

## 2022-06-12 NOTE — Anesthesia Preprocedure Evaluation (Signed)
Anesthesia Evaluation  Patient identified by MRN, date of birth, ID band Patient awake    Reviewed: Allergy & Precautions, NPO status , Patient's Chart, lab work & pertinent test results  History of Anesthesia Complications Negative for: history of anesthetic complications  Airway Mallampati: I  TM Distance: >3 FB Neck ROM: Full    Dental no notable dental hx.    Pulmonary neg pulmonary ROS, former smoker,    Pulmonary exam normal breath sounds clear to auscultation       Cardiovascular Exercise Tolerance: Good negative cardio ROS Normal cardiovascular exam Rhythm:Regular Rate:Normal     Neuro/Psych negative neurological ROS  negative psych ROS   GI/Hepatic negative GI ROS, Neg liver ROS,   Endo/Other  negative endocrine ROS  Renal/GU negative Renal ROS  negative genitourinary   Musculoskeletal negative musculoskeletal ROS (+)   Abdominal (+) + obese,   Peds negative pediatric ROS (+)  Hematology negative hematology ROS (+)   Anesthesia Other Findings   Reproductive/Obstetrics negative OB ROS                             Anesthesia Physical Anesthesia Plan  ASA: 2  Anesthesia Plan: General ETT   Post-op Pain Management: Celebrex PO (pre-op)*, Dilaudid IV, Tylenol PO (pre-op)* and Gabapentin PO (pre-op)*   Induction: Intravenous  PONV Risk Score and Plan: 2 and Ondansetron, Dexamethasone, Midazolam and Treatment may vary due to age or medical condition  Airway Management Planned: Oral ETT  Additional Equipment:   Intra-op Plan:   Post-operative Plan: Extubation in OR  Informed Consent: I have reviewed the patients History and Physical, chart, labs and discussed the procedure including the risks, benefits and alternatives for the proposed anesthesia with the patient or authorized representative who has indicated his/her understanding and acceptance.     Dental Advisory  Given  Plan Discussed with: Anesthesiologist, CRNA and Surgeon  Anesthesia Plan Comments: (Patient consented for risks of anesthesia including but not limited to:  - adverse reactions to medications - damage to eyes, teeth, lips or other oral mucosa - nerve damage due to positioning  - sore throat or hoarseness - Damage to heart, brain, nerves, lungs, other parts of body or loss of life  Patient voiced understanding.)        Anesthesia Quick Evaluation

## 2022-06-12 NOTE — Anesthesia Procedure Notes (Signed)
Procedure Name: Intubation Date/Time: 06/12/2022 7:33 AM  Performed by: Lerry Liner, CRNAPre-anesthesia Checklist: Patient identified, Emergency Drugs available, Suction available and Patient being monitored Patient Re-evaluated:Patient Re-evaluated prior to induction Oxygen Delivery Method: Circle system utilized Preoxygenation: Pre-oxygenation with 100% oxygen Induction Type: IV induction Ventilation: Mask ventilation without difficulty Laryngoscope Size: McGraph and 4 Grade View: Grade I Tube type: Oral Tube size: 7.5 mm Number of attempts: 1 Airway Equipment and Method: Stylet and Oral airway Placement Confirmation: ETT inserted through vocal cords under direct vision, positive ETCO2 and breath sounds checked- equal and bilateral Secured at: 22 cm Tube secured with: Tape Dental Injury: Teeth and Oropharynx as per pre-operative assessment

## 2022-06-12 NOTE — Anesthesia Postprocedure Evaluation (Signed)
Anesthesia Post Note  Patient: Mark Spencer  Procedure(s) Performed: XI ROBOTIC ASSISTED VENTRAL HERNIA (Abdomen) INSERTION OF MESH  Patient location during evaluation: PACU Anesthesia Type: General Level of consciousness: awake and alert Pain management: pain level controlled Vital Signs Assessment: post-procedure vital signs reviewed and stable Respiratory status: spontaneous breathing, nonlabored ventilation, respiratory function stable and patient connected to nasal cannula oxygen Cardiovascular status: blood pressure returned to baseline and stable Postop Assessment: no apparent nausea or vomiting Anesthetic complications: no   No notable events documented.   Last Vitals:  Vitals:   06/12/22 1130 06/12/22 1137  BP: 128/65   Pulse: 74 63  Resp: 16 15  Temp:    SpO2: 95% 95%    Last Pain:  Vitals:   06/12/22 1137  TempSrc:   PainSc: 6                  Ilene Qua

## 2022-06-12 NOTE — Discharge Instructions (Addendum)
  Diet: Resume home heart healthy regular diet.   Activity: No heavy lifting >20 pounds (children, pets, laundry, garbage) or strenuous activity until follow-up, but light activity and walking are encouraged. Do not drive or drink alcohol if taking narcotic pain medications.  Wound care: May shower with soapy water and pat dry (do not rub incisions), but no baths or submerging incision underwater until follow-up. (no swimming)   Medications: Resume all home medications. For mild to moderate pain: acetaminophen (Tylenol) ***or ibuprofen (if no kidney disease). Combining Tylenol with alcohol can substantially increase your risk of causing liver disease. Narcotic pain medications, if prescribed, can be used for severe pain, though may cause nausea, constipation, and drowsiness. Do not combine Tylenol and Norco within a 6 hour period as Norco contains Tylenol. If you do not need the narcotic pain medication, you do not need to fill the prescription.  Call office ([phone]) at any time if any questions, worsening pain, fevers/chills, bleeding, drainage from incision site, or other concerns.   AMBULATORY SURGERY  DISCHARGE INSTRUCTIONS   The drugs that you were given will stay in your system until tomorrow so for the next 24 hours you should not:  Drive an automobile Make any legal decisions Drink any alcoholic beverage   You may resume regular meals tomorrow.  Today it is better to start with liquids and gradually work up to solid foods.  You may eat anything you prefer, but it is better to start with liquids, then soup and crackers, and gradually work up to solid foods.   Please notify your doctor immediately if you have any unusual bleeding, trouble breathing, redness and pain at the surgery site, drainage, fever, or pain not relieved by medication.    Additional Instructions:        Please contact your physician with any problems or Same Day Surgery at [phone], Monday  through Friday 6 am to 4 pm, or [place] number at [phone].  

## 2022-06-12 NOTE — H&P (Signed)
[name] is a 36 y.o.male patient who comes for follow up for evaluation of CT scan images and coordination of surgery.  Patient presented with bulging and pain in the left abdominal wall. Patient had history of complicated diverticulitis with Hartmann procedure and eventual reversal of colostomy. He now has a bulge in the left lower quadrant incision and in the midline incision. He endorses pain with protrusion of the bulge. Pain localized to the abdominal wall. No pain radiation. Aggravating factor is resting. Aggravating factor is applying pressure to the bulge. Denies any episode of abdominal distention, nausea or vomiting. Patient tolerating diet. Patient passing gas.  He had a CT scan of the abdomen and pelvis that shows an incisional hernia in the left lower quadrant colostomy incision and a small incisional hernia in the midline incision. This hernia is are 11 cm apart. I personally evaluated the images of the CT scan. No sign of obstruction.   PAST MEDICAL HISTORY:  History reviewed. No pertinent past medical history.    PAST SURGICAL HISTORY:  Past Surgical History:  Procedure Laterality Date  COLON SURGERY 11/04/2020  Dr Lesli Albee -- ROBOTIC sigmoid colectomy  COLONOSCOPY 05/31/2021  Normal colon/Repeat 21yrs/[place]  colostomy takedown 06/08/2021  Dr Lesli Albee --- ROBOTIC  LAPAROSCOPIC ILEOSTOMY TAKEDOWN 09/05/2021  Dr Lesli Albee  ADENOIDECTOMY    MEDICATIONS:  Outpatient Encounter Medications as of 05/09/2022  Medication Sig Dispense Refill  neomycin 500 mg tablet Take 2 tablets at 2 pm, 3 pm and 10 pm the day before surgery. (Patient not taking: Reported on 08/16/2021) 6 tablet 0  oxyCODONE-acetaminophen (PERCOCET) 5-325 mg tablet Take 1 tablet by mouth every 6 (six) hours as needed for Pain (Patient not taking: Reported on 09/29/2021) 20 tablet 0  pantoprazole (PROTONIX) 40 MG DR tablet TAKE 1 TABLET (40 MG TOTAL) BY MOUTH ONCE DAILY TAKE 15-20 MINS BEFORE  MEAL (Patient not taking: Reported on 04/07/2021) 90 tablet 2   No facility-administered encounter medications on file as of 05/09/2022.    ALLERGIES:  Patient has no known allergies.  SOCIAL HISTORY:  Social History   Socioeconomic History  Marital status: Married  Tobacco Use  Smoking status: Former  Types: Cigarettes  Quit date: 03/25/2013  Years since quitting: 9.1  Smokeless tobacco: Former  Quit date: 02/23/2006  Vaping Use  Vaping Use: Never used  Substance and Sexual Activity  Alcohol use: Yes  Comment: glass of wine occasionally  Drug use: Never   FAMILY HISTORY:  Family History  Problem Relation Age of Onset  Obesity Mother  Heart disease Father    GENERAL REVIEW OF SYSTEMS:   General ROS: negative for - chills, fatigue, fever, weight gain or weight loss Allergy and Immunology ROS: negative for - hives  Hematological and Lymphatic ROS: negative for - bleeding problems or bruising, negative for palpable nodes Endocrine ROS: negative for - heat or cold intolerance, hair changes Respiratory ROS: negative for - cough, shortness of breath or wheezing Cardiovascular ROS: no chest pain or palpitations GI ROS: negative for nausea, vomiting, abdominal pain, diarrhea, constipation Musculoskeletal ROS: negative for - joint swelling or muscle pain Neurological ROS: negative for - confusion, syncope Dermatological ROS: negative for pruritus and rash  PHYSICAL EXAM:  Vitals:  05/09/22 1129  BP: 129/70  Pulse: 102  .  Ht:193 cm (6\' 4" ) Wt:(!) 127.9 kg (282 lb) SWN:IOEV surface area is 2.62 meters squared. Body mass index is 34.33 kg/m.Marland Kitchen  GENERAL: Alert, active, oriented x3  HEENT: Pupils equal reactive  to light. Extraocular movements are intact. Sclera clear. Palpebral conjunctiva normal red color.Pharynx clear.  NECK: Supple with no palpable mass and no adenopathy.  LUNGS: Sound clear with no rales rhonchi or wheezes.  HEART: Regular rhythm S1 and S2 without  murmur.  ABDOMEN: Soft and depressible, nontender with no palpable mass, no hepatomegaly. Palpable bulge in the left lower quadrant and midline. Left lower quadrant hernia is incarcerated. The midline incision hernia is reducible.  EXTREMITIES: Well-developed well-nourished symmetrical with no dependent edema.  NEUROLOGICAL: Awake alert oriented, facial expression symmetrical, moving all extremities.   IMPRESSION:   Incisional hernia, without obstruction or gangrene [K43.2]  Patient with incisional hernia. There are 2 incisional hernia wanting the left lower quadrant colostomy incision and one in the mid midline close to the periumbilical area. This hernia measures about 3 to 4 cm with 11 cm apart. The left lower quadrant incision is incarcerated. There is no bowel involved. There is no sign of obstruction. I discussed with patient recommendation of repair of the incisional hernias. We discussed about minimally invasive and open approach and possible combination of both approach.  We discussed the with the patient about risk of surgery that includes bleeding, infection, injury to intestine, intestinal fistula, intra-abdominal infection, pain, and other complications. The patient report he understood and would like to proceed with repair of incisional hernia.   PLAN:  1. Robotic assist laparoscopic incisional hernia repair [phone]) 2. Avoid taking aspirin 5 days before surgery 3. Contact us if you have any concern  Patient verbalized understanding, all questions were answered, and were agreeable with the plan outlined above.   Herbert Pun, MD

## 2022-06-12 NOTE — Op Note (Signed)
Preoperative diagnosis: Incisional Hernia  Postoperative diagnosis: Incisional Hernia  Procedure: Robotic assisted laparoscopic incisional hernia repair with mesh  Anesthesia: General  Surgeon: Dr. Windell Moment  Wound Classification: Clean  Specimen: None  Complications: None  Estimated Blood Loss: 67ml  Indications: Patient is a 36 y.o. male developed an incisional midline and left abdominal wall hernias. This was symptomatic and incarcerated and repair was indicated.   Findings: Midline hernia was 11 cm and left colostomy wound hernia was 2 cm 2. Repair achieved with closure of the anterior fascia and a 20 x 15 cm Bard mesh for the midline and a round 11.4 cm for the left colostomy wound hernia 3. Adequate hemostasis             Description of procedure: The patient was brought to the operating room and general anesthesia was induced. A time-out was completed verifying correct patient, procedure, site, positioning, and implant(s) and/or special equipment prior to beginning this procedure. Antibiotics were administered prior to making the incision. SCDs placed. The anterior abdominal wall was prepped and draped in the standard sterile fashion.   Cut down technique was chosen for entry. This was done through the colostomy scar.  The skin was open and dissection was taken down to the fascia.  Fascia was opened.  Abdominal cavity was entered.  A mini GelPort was placed and abdomen insufflated to 15cm without any dramatic increase in pressure.  An 8 mm port was inserted through the GelPort.  No injury noted during placement. Exparel was infused in a TAP block.  Three additional 8 mm ports, were inserted along right lateral aspect.  Xi robot then docked into place.  Abundant amount of adhesions were identified.  A very difficult time-consuming lysis of adhesion was needed to be done to be able to expose the hernia.  Once the lysis of lesion was done the hernia contents were seen.   Incarcerated omental fat content was noted and reduced with combination of blunt, sharp dissection with scissors and fenestrated forceps.  Hemostasis achieved throughout this portion.  Once all hernia contents reduced, the 2 hernias were identified as described above.  I scrubbed back to insert the mesh and closed the skin to maintain pneumoperitoneum.  A 12 mm port was inserted through the GelPort.  The was used to insert the 20 cm x 15 cm mesh and the 11.4 cm round mesh.  Then the medial port was removed and the skin was temporarily closed to maintain pneumoperitoneum.  Insufflation dropped to 76mm and transfacial suture with 0 stratafix used to primarily close defects under minimal tension. Bard protected 11.4 cm round mesh was placed on the left colostomy wound hernia and secured to the abdominal wall centered over the defect using the 0 stratafix previously used to primarily close defect.  The mesh was then circumferentially sutured into the anterior abdominal wall using 2-0 VLock x2.  Any bleeding noted during this portion was no longer actively bleeding by end of securing mesh and tightening the suture.  Then I placed the 15 cm x 20 cm mesh on the midline.  The mesh was also circumferentially suture with 2-0 V lock.  Robot was undocked.  Abdomen then desufflated while camera within abdomen to ensure no signs of new bleed prior to removing camera and rest of ports completely.  All skin incisions closed with runninrg 4-0 Monocryl in a subcuticular fashion.  All wounds then dressed with Dermabond.  Patient was then successfully awakened and transferred to PACU in  stable condition.  At the end of the procedure sponge and instrument counts were correct.

## 2022-06-12 NOTE — Transfer of Care (Signed)
Immediate Anesthesia Transfer of Care Note  Patient: Mark Spencer  Procedure(s) Performed: XI ROBOTIC ASSISTED VENTRAL HERNIA (Abdomen) INSERTION OF MESH  Patient Location: PACU  Anesthesia Type:General  Level of Consciousness: drowsy  Airway & Oxygen Therapy: Patient Spontanous Breathing and Patient connected to face mask oxygen  Post-op Assessment: Report given to RN  Post vital signs: stable  Last Vitals:  Vitals Value Taken Time  BP    Temp    Pulse    Resp    SpO2      Last Pain:  Vitals:   06/12/22 0619  TempSrc: Temporal  PainSc: 1          Complications: No notable events documented.

## 2022-06-13 ENCOUNTER — Encounter: Payer: Self-pay | Admitting: General Surgery

## 2023-07-24 ENCOUNTER — Encounter: Payer: Self-pay | Admitting: Family Medicine

## 2023-07-24 ENCOUNTER — Ambulatory Visit (INDEPENDENT_AMBULATORY_CARE_PROVIDER_SITE_OTHER): Payer: BC Managed Care – PPO

## 2023-07-24 ENCOUNTER — Ambulatory Visit: Payer: BC Managed Care – PPO | Admitting: Family Medicine

## 2023-07-24 VITALS — BP 120/82 | HR 89 | Ht 76.0 in | Wt 255.0 lb

## 2023-07-24 DIAGNOSIS — M5416 Radiculopathy, lumbar region: Secondary | ICD-10-CM

## 2023-07-24 MED ORDER — PREDNISONE 50 MG PO TABS: 50.0000 mg | ORAL_TABLET | Freq: Every day | ORAL | 0 refills | Status: DC

## 2023-07-24 MED ORDER — GABAPENTIN 300 MG PO CAPS: 300.0000 mg | ORAL_CAPSULE | Freq: Three times a day (TID) | ORAL | 3 refills | Status: DC | PRN

## 2023-07-24 NOTE — Patient Instructions (Signed)
Thank you for coming in today.   Please get an Xray today before you leave   You should hear from MRI scheduling within 1 week. If you do not hear please let me know.    Take the prednisone and use gabapentin as needed.   Let me know if this is not working or you have problems.

## 2023-07-24 NOTE — Progress Notes (Signed)
I, Stevenson Clinch, CMA acting as a scribe for Mark Graham, MD.  Mark Spencer is a 37 y.o. male who presents to [facility] at [facility] [place] today for low back pain x 3 months. Pt locates pain to ower back, initially right sided. Has been seen by [facility] and PT, has massage and laser therapy. Now having pain on the left side lower back. Pain radiating into the gluteal region down the leg to the toes. Denies groin pain. Pain radiating into the mid back when coughing, sneezing, sitting, bending.   Radiating pain: L LE LE numbness/tingling: intermittent, L LE LE weakness: weakness from fatigue Aggravates: constant, sitting Treatments tried: IBU, Tylenol, heat, ice no bowel or bladder dysfunction.  No incontinence.  Pertinent review of systems: No fevers or chills  Relevant historical information: History of diverticulosis with perforation of the colon requiring ileostomy.  This has been reversed.   Exam:  BP 120/82   Pulse 89   Ht 6\' 4"  (1.93 m)   Wt 255 lb (115.7 kg)   SpO2 96%   BMI 31.04 kg/m  General: Well Developed, well nourished, and in no acute distress.   MSK: L-spine: Normal appearing Nontender palpation spinal midline. Decreased lumbar motion. Lower extremity strength mildly decreased left hip flexion 4/5 with pain.  Otherwise lower extremity strength is intact. Reflexes are intact.  Positive left-sided slump test.    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  No acute fractures are visible. Await formal radiology review    Assessment and Plan: 37 y.o. male with left lumbar radiculopathy.  Symptoms ongoing now for almost 3 months.  He has been under the care of a chiropractor and physical therapy over the last 2 months.  Symptoms have worsened.  His symptoms today are severe up to 10 out of 10 pain at times.  Plan for trial of prednisone and gabapentin.  Will  proceed directly to x-ray and lumbar spine MRI for epidural steroid injection planning.   PDMP not reviewed this encounter. Orders Placed This Encounter  Procedures   DG Lumbar Spine 2-3 Views    Standing Status:   Future    Number of Occurrences:   1    Standing Expiration Date:   08/24/2023    Order Specific Question:   Reason for Exam (SYMPTOM  OR DIAGNOSIS REQUIRED)    Answer:   lower back pain    Order Specific Question:   Preferred imaging location?    Answer:   [facility]   MR Lumbar Spine Wo Contrast    Standing Status:   Future    Standing Expiration Date:   07/23/2024    Order Specific Question:   What is the patient's sedation requirement?    Answer:   No Sedation    Order Specific Question:   Does the patient have a pacemaker or implanted devices?    Answer:   No    Order Specific Question:   Preferred imaging location?    Answer:   [facility] (table limit-550lbs)   Meds ordered this encounter  Medications   predniSONE (DELTASONE) 50 MG tablet    Sig: Take 1 tablet (50 mg total) by mouth daily.    Dispense:  5 tablet    Refill:  0   gabapentin (NEURONTIN) 300 MG capsule    Sig: Take 1 capsule (300 mg total) by mouth 3 (three) times daily as  needed.    Dispense:  90 capsule    Refill:  3     Discussed warning signs or symptoms. Please see discharge instructions. Patient expresses understanding.   The above documentation has been reviewed and is accurate and complete Mark Spencer, M.D.

## 2023-07-25 ENCOUNTER — Encounter: Payer: Self-pay | Admitting: Family Medicine

## 2023-07-25 DIAGNOSIS — M5416 Radiculopathy, lumbar region: Secondary | ICD-10-CM

## 2023-07-25 MED ORDER — HYDROCODONE-ACETAMINOPHEN 5-325 MG PO TABS: 1.0000 | ORAL_TABLET | Freq: Four times a day (QID) | ORAL | 0 refills | Status: DC | PRN

## 2023-07-25 NOTE — Telephone Encounter (Signed)
I called Josh back.  Prescribed hydrocodone.  Hopefully the prednisone will start working in a day or 2.

## 2023-07-27 ENCOUNTER — Encounter: Payer: Self-pay | Admitting: Family Medicine

## 2023-07-28 ENCOUNTER — Other Ambulatory Visit: Payer: Self-pay | Admitting: Family Medicine

## 2023-07-30 ENCOUNTER — Telehealth: Payer: Self-pay | Admitting: Family Medicine

## 2023-07-30 MED ORDER — HYDROCODONE-ACETAMINOPHEN 10-325 MG PO TABS: 1.0000 | ORAL_TABLET | Freq: Three times a day (TID) | ORAL | 0 refills | Status: DC | PRN

## 2023-07-30 NOTE — Addendum Note (Signed)
Addended by: Rodolph Bong on: 07/30/2023 03:53 PM   Modules accepted: Orders

## 2023-07-30 NOTE — Telephone Encounter (Signed)
Patient called asking if he could speak to someone in regards to his pain.  Please advise.

## 2023-07-31 ENCOUNTER — Ambulatory Visit
Admission: RE | Admit: 2023-07-31 | Discharge: 2023-07-31 | Disposition: A | Discharge status: Home / Self Care | Payer: BC Managed Care – PPO | Source: Ambulatory Visit | Attending: Family Medicine | Admitting: Family Medicine

## 2023-07-31 ENCOUNTER — Other Ambulatory Visit: Payer: Self-pay | Admitting: Family Medicine

## 2023-07-31 DIAGNOSIS — M5416 Radiculopathy, lumbar region: Secondary | ICD-10-CM

## 2023-08-01 NOTE — Telephone Encounter (Signed)
Forwarding to Dr. Georgina Snell as Juluis Rainier.

## 2023-08-02 ENCOUNTER — Other Ambulatory Visit: Payer: BC Managed Care – PPO

## 2023-08-02 NOTE — Telephone Encounter (Signed)
Has been discussed in [employer].

## 2023-08-02 NOTE — Telephone Encounter (Signed)
Forwarding to Dr. Corey to review and advise.  

## 2023-08-02 NOTE — Addendum Note (Signed)
Addended by: Rodolph Bong on: 08/02/2023 06:17 AM   Modules accepted: Orders

## 2023-08-03 ENCOUNTER — Other Ambulatory Visit: Payer: Self-pay | Admitting: Family Medicine

## 2023-08-06 MED ORDER — HYDROCODONE-ACETAMINOPHEN 10-325 MG PO TABS: 1.0000 | ORAL_TABLET | Freq: Three times a day (TID) | ORAL | 0 refills | Status: DC | PRN

## 2023-08-06 NOTE — Discharge Instructions (Signed)

## 2023-08-06 NOTE — Telephone Encounter (Signed)
Last OV 07/24/23 Next OV not scheduled  Last refill 07/30/23 Qty # 15/0  Controlled substance, forwarding to Dr. Denyse Amass

## 2023-08-07 ENCOUNTER — Telehealth: Payer: Self-pay

## 2023-08-07 ENCOUNTER — Ambulatory Visit
Admission: RE | Admit: 2023-08-07 | Discharge: 2023-08-07 | Disposition: A | Discharge status: Home / Self Care | Payer: BC Managed Care – PPO | Source: Ambulatory Visit | Attending: Family Medicine | Admitting: Family Medicine

## 2023-08-07 DIAGNOSIS — M5416 Radiculopathy, lumbar region: Secondary | ICD-10-CM

## 2023-08-07 MED ORDER — METHYLPREDNISOLONE ACETATE 40 MG/ML INJ SUSP (RADIOLOG
80.0000 mg | Freq: Once | INTRAMUSCULAR | Status: AC
  Administered 2023-08-07: 80 mg via EPIDURAL

## 2023-08-07 MED ORDER — IOPAMIDOL (ISOVUE-M 200) INJECTION 41%
1.0000 mL | Freq: Once | INTRAMUSCULAR | Status: AC
  Administered 2023-08-07: 1 mL via EPIDURAL

## 2023-08-07 NOTE — Telephone Encounter (Signed)
Form completed and placed and Dr. Zollie Pee desk to review and sign.

## 2023-08-07 NOTE — Telephone Encounter (Signed)
Prior Auth APPROVED PA# [id number] Valid:08/07/23-02/03/24

## 2023-08-07 NOTE — Telephone Encounter (Signed)
FMLA form received 08/06/23.

## 2023-08-07 NOTE — Telephone Encounter (Signed)
Prior Authorization initiated for HYDROCODONE-APAP via [email or web address] KEY: [id number]

## 2023-08-07 NOTE — Telephone Encounter (Signed)
CoverMyMeds.com KEY: [id number]

## 2023-08-07 NOTE — Telephone Encounter (Signed)
From reviewed and signed by Dr. Denyse Amass.   Form faxed back to [employer] and placed at the front desk for faxing/scanning.

## 2023-08-08 ENCOUNTER — Encounter: Payer: Self-pay | Admitting: Family Medicine

## 2023-08-08 MED ORDER — GABAPENTIN 300 MG PO CAPS: 300.0000 mg | ORAL_CAPSULE | Freq: Three times a day (TID) | ORAL | 3 refills | Status: DC | PRN

## 2023-08-08 NOTE — Progress Notes (Signed)
Lumbar spine x-ray shows some arthritis.  This is better seen on the MRI.

## 2023-08-08 NOTE — Progress Notes (Signed)
Lumbar spine MRI shows a bulging disc pressing on the left L5 nerve root.  This is what was targeted with the back injection you just had them does explain your pain.  If you are not improving I would recommend a surgery consultation.  Hopefully the injection will help.  Please let me know.

## 2023-08-08 NOTE — Telephone Encounter (Signed)
Spoke with patient and reviewed intermittent leave request. Pt verbalized understanding. Notes slight improvement of sx s/p injection. Gabapentin refilled.

## 2023-08-08 NOTE — Telephone Encounter (Addendum)
Rx for Gabapentin has been refilled. Pt aware.

## 2023-08-08 NOTE — Telephone Encounter (Signed)
Pt called, would like to go over the restrictions noted in the FMLA ppwk as he does not have a copy. I do NOT see up front in scanning.  Please call pt [phone]

## 2023-08-13 ENCOUNTER — Other Ambulatory Visit: Payer: Self-pay | Admitting: Family Medicine

## 2023-08-13 MED ORDER — HYDROCODONE-ACETAMINOPHEN 10-325 MG PO TABS: 1.0000 | ORAL_TABLET | Freq: Three times a day (TID) | ORAL | 0 refills | Status: DC | PRN

## 2023-08-13 MED ORDER — GABAPENTIN 300 MG PO CAPS: 300.0000 mg | ORAL_CAPSULE | Freq: Three times a day (TID) | ORAL | 3 refills | Status: DC | PRN

## 2023-08-13 NOTE — Telephone Encounter (Signed)
I refilled the hydrocodone and I changed the gabapentin prescription so he can get it sooner.  I did not refill the prednisone.

## 2023-08-13 NOTE — Telephone Encounter (Signed)
Patient contacted the pharmacy. They state that they are not able to fill it until Nov 25th. Is there a way to have a early refill or change something around?  Please advise.

## 2023-08-20 ENCOUNTER — Other Ambulatory Visit: Payer: Self-pay | Admitting: Family Medicine

## 2023-08-20 NOTE — Telephone Encounter (Signed)
Controlled substance, forwarding to Dr. Denyse Amass

## 2023-08-21 ENCOUNTER — Encounter: Payer: Self-pay | Admitting: Family Medicine

## 2023-08-21 DIAGNOSIS — M5416 Radiculopathy, lumbar region: Secondary | ICD-10-CM

## 2023-08-21 MED ORDER — HYDROCODONE-ACETAMINOPHEN 10-325 MG PO TABS: 1.0000 | ORAL_TABLET | Freq: Three times a day (TID) | ORAL | 0 refills | Status: DC | PRN

## 2023-08-21 NOTE — Telephone Encounter (Signed)
Forwarding to Dr. Corey to review and advise.  

## 2023-08-28 ENCOUNTER — Other Ambulatory Visit: Payer: Self-pay | Admitting: Family Medicine

## 2023-08-29 MED ORDER — HYDROCODONE-ACETAMINOPHEN 10-325 MG PO TABS: 1.0000 | ORAL_TABLET | Freq: Three times a day (TID) | ORAL | 0 refills | Status: DC | PRN

## 2023-08-29 NOTE — Telephone Encounter (Signed)
Last OV 07/24/23  Next OV not scheduled  Last refill 08/21/23  Qty #15/0

## 2023-08-29 NOTE — Telephone Encounter (Signed)
Try to cut it back to 1 a day or less if possible.

## 2023-09-03 NOTE — Discharge Instructions (Signed)

## 2023-09-04 ENCOUNTER — Ambulatory Visit
Admission: RE | Admit: 2023-09-04 | Discharge: 2023-09-04 | Disposition: A | Discharge status: Home / Self Care | Payer: BC Managed Care – PPO | Source: Ambulatory Visit | Attending: Family Medicine | Admitting: Family Medicine

## 2023-09-04 ENCOUNTER — Other Ambulatory Visit: Payer: Self-pay | Admitting: Family Medicine

## 2023-09-04 DIAGNOSIS — M5416 Radiculopathy, lumbar region: Secondary | ICD-10-CM

## 2023-09-04 MED ORDER — METHYLPREDNISOLONE ACETATE 40 MG/ML INJ SUSP (RADIOLOG
80.0000 mg | Freq: Once | INTRAMUSCULAR | Status: AC
  Administered 2023-09-04: 80 mg via EPIDURAL

## 2023-09-04 MED ORDER — IOPAMIDOL (ISOVUE-M 200) INJECTION 41%
1.0000 mL | Freq: Once | INTRAMUSCULAR | Status: AC
  Administered 2023-09-04: 1 mL via EPIDURAL

## 2023-09-17 MED ORDER — GABAPENTIN 300 MG PO CAPS: 300.0000 mg | ORAL_CAPSULE | Freq: Three times a day (TID) | ORAL | 0 refills | Status: DC | PRN

## 2023-09-17 NOTE — Telephone Encounter (Signed)
Forwarding to Dr. Jean Rosenthal as Dr. Denyse Amass is out of the office this week.   Also forwarding to Dr. Denyse Amass to review once he returns to the office.

## 2023-09-17 NOTE — Addendum Note (Signed)
Addended by: Dierdre Searles on: 09/17/2023 02:10 PM   Modules accepted: Orders

## 2023-10-24 ENCOUNTER — Encounter: Payer: Self-pay | Admitting: Family Medicine

## 2023-10-24 DIAGNOSIS — M5416 Radiculopathy, lumbar region: Secondary | ICD-10-CM

## 2023-10-29 NOTE — Addendum Note (Signed)
Addended by: Rodolph Bong on: 10/29/2023 06:51 AM   Modules accepted: Orders

## 2023-11-05 NOTE — Discharge Instructions (Signed)

## 2023-11-06 ENCOUNTER — Ambulatory Visit
Admission: RE | Admit: 2023-11-06 | Discharge: 2023-11-06 | Disposition: A | Discharge status: Home / Self Care | Payer: BC Managed Care – PPO | Source: Ambulatory Visit | Attending: Family Medicine | Admitting: Family Medicine

## 2023-11-06 DIAGNOSIS — M5416 Radiculopathy, lumbar region: Secondary | ICD-10-CM

## 2023-11-06 MED ORDER — METHYLPREDNISOLONE ACETATE 40 MG/ML INJ SUSP (RADIOLOG
80.0000 mg | Freq: Once | INTRAMUSCULAR | Status: AC
  Administered 2023-11-06: 80 mg via EPIDURAL

## 2023-11-06 MED ORDER — IOPAMIDOL (ISOVUE-M 200) INJECTION 41%
1.0000 mL | Freq: Once | INTRAMUSCULAR | Status: AC
  Administered 2023-11-06: 1 mL via EPIDURAL

## 2024-09-01 ENCOUNTER — Other Ambulatory Visit: Payer: Self-pay

## 2024-09-01 ENCOUNTER — Emergency Department (HOSPITAL_COMMUNITY)
Admission: EM | Admit: 2024-09-01 | Discharge: 2024-09-01 | Disposition: A | Discharge status: Home / Self Care | Payer: Self-pay | Attending: Emergency Medicine | Admitting: Emergency Medicine

## 2024-09-01 ENCOUNTER — Encounter (HOSPITAL_COMMUNITY): Payer: Self-pay

## 2024-09-01 DIAGNOSIS — M5432 Sciatica, left side: Secondary | ICD-10-CM

## 2024-09-01 HISTORY — DX: Sciatica, unspecified side: M54.30

## 2024-09-01 MED ORDER — KETOROLAC TROMETHAMINE 15 MG/ML IJ SOLN
15.0000 mg | Freq: Once | INTRAMUSCULAR | Status: AC
  Administered 2024-09-01: 15 mg via INTRAMUSCULAR
  Filled 2024-09-01: qty 1

## 2024-09-01 MED ORDER — FENTANYL CITRATE (PF) 50 MCG/ML IJ SOSY
50.0000 ug | PREFILLED_SYRINGE | Freq: Once | INTRAMUSCULAR | Status: AC
  Administered 2024-09-01: 50 ug via INTRAMUSCULAR
  Filled 2024-09-01: qty 1

## 2024-09-01 MED ORDER — OXYCODONE-ACETAMINOPHEN 5-325 MG PO TABS: 1.0000 | ORAL_TABLET | Freq: Four times a day (QID) | ORAL | 0 refills | Status: AC | PRN

## 2024-09-01 MED ORDER — METHOCARBAMOL 500 MG PO TABS
1000.0000 mg | ORAL_TABLET | Freq: Once | ORAL | Status: AC
  Administered 2024-09-01: 1000 mg via ORAL
  Filled 2024-09-01: qty 2

## 2024-09-01 MED ORDER — METHOCARBAMOL 500 MG PO TABS: 500.0000 mg | ORAL_TABLET | Freq: Two times a day (BID) | ORAL | 0 refills | Status: AC

## 2024-09-01 MED ORDER — GABAPENTIN 300 MG PO CAPS
300.0000 mg | ORAL_CAPSULE | Freq: Once | ORAL | Status: AC
  Administered 2024-09-01: 300 mg via ORAL
  Filled 2024-09-01: qty 1

## 2024-09-01 MED ORDER — GABAPENTIN 100 MG PO CAPS: 100.0000 mg | ORAL_CAPSULE | Freq: Once | ORAL | Status: DC

## 2024-09-01 MED ORDER — GABAPENTIN 300 MG PO CAPS: 300.0000 mg | ORAL_CAPSULE | Freq: Three times a day (TID) | ORAL | 0 refills | Status: DC

## 2024-09-01 MED ORDER — MORPHINE SULFATE (PF) 4 MG/ML IV SOLN
4.0000 mg | Freq: Once | INTRAVENOUS | Status: AC
  Administered 2024-09-01: 4 mg via INTRAVENOUS
  Filled 2024-09-01: qty 1

## 2024-09-01 NOTE — ED Notes (Signed)
 First poc with patient. PT brought to room via wheelchair from triage.  PT found sitting in chair, assisted to bed for initial assessment. PT c/o severe left lumbar pain x4 weeks.  Pt states diagnosed with sciatica last year.  PT states taking Tylenol  and Ibuprofen around the clock.  Last dose of each was while in waiting room.  PT c/o nausea.  Denies any loss of bowel or bladder.  Spouse of patient at bedside.  Pt axox4. GCS 15.

## 2024-09-01 NOTE — Discharge Instructions (Signed)
 Please use Tylenol  or ibuprofen for pain.  You may use 600 mg ibuprofen every 6 hours or 1000 mg of Tylenol  every 6 hours.  You may choose to alternate between the 2.  This would be most effective.  Not to exceed 4 g of Tylenol  within 24 hours.  Not to exceed 3200 mg ibuprofen 24 hours.  You can use the muscle relaxant I am prescribing in addition to the above to help with any breakthrough pain.  You can take it up to twice daily.  It is safe to take at night, but I would be cautious taking it during the day as it can cause some drowsiness.  Make sure that you are feeling awake and alert before you get behind the wheel of a car or operate a motor vehicle.  It is not a narcotic pain medication so you are able to take it if it is not making you drowsy and still pilot a vehicle or machinery safely.  You can use the stronger narcotic pain medication in place of Tylenol  for severe break through pain.  If you take the narcotic pain medication that we prescribed recommend that you also take a laxative such as MiraLAX or Dulcolax every day that you take the narcotic pain medicine, and drink plenty of fluids, 50 to 64 ounces to prevent any constipation.

## 2024-09-01 NOTE — ED Notes (Signed)
 PT axox4. GCS 15. PT and spouse of patient verbalize understanding of discharge instructions, follow up and new rx. PT escorted out via wheelchair by primary RN to transportation home with spouse

## 2024-09-01 NOTE — ED Provider Notes (Signed)
 [place] EMERGENCY DEPARTMENT AT [facility] Provider Note   CSN: [id number] Arrival date & time: 09/01/24  1646     Patient presents with: Back Pain   Mark Spencer is a 38 y.o. male with past medical history significant for previous colostomy, ileostomy, diverticulitis who presents concern for low back pain, history of slipped disks, sciatic nerve impingement.  Low back pain radiates down left leg, has been worsening over the last 3 to 4 weeks.  He has been taking ibuprofen, Tylenol  at maximum allowed doses, but pain today is significantly worse than typical.  He is post to be seen by orthopedics in 2 days, has had injections in the past which gave him significant improvement of pain.    Back Pain      Prior to Admission medications   Medication Sig Start Date End Date Taking? Authorizing Provider  gabapentin  (NEURONTIN ) 300 MG capsule Take 1 capsule (300 mg total) by mouth 3 (three) times daily. 09/01/24  Yes Falon Huesca H, PA-C  methocarbamol  (ROBAXIN ) 500 MG tablet Take 1 tablet (500 mg total) by mouth 2 (two) times daily. 09/01/24  Yes Brighton Pilley H, PA-C  oxyCODONE -acetaminophen  (PERCOCET/ROXICET) 5-325 MG tablet Take 1 tablet by mouth every 6 (six) hours as needed for severe pain (pain score 7-10). 09/01/24  Yes Ruthia Person H, PA-C  HYDROcodone -acetaminophen  (NORCO) 10-325 MG tablet Take 1 tablet by mouth every 8 (eight) hours as needed. 08/29/23   Corey, Evan S, MD  predniSONE  (DELTASONE ) 50 MG tablet Take 1 tablet (50 mg total) by mouth daily. 07/24/23   Joane Artist RAMAN, MD    Allergies: Patient has no known allergies.    Review of Systems  Musculoskeletal:  Positive for back pain.  All other systems reviewed and are negative.   Updated Vital Signs BP (!) 114/59   Pulse 95   Temp 97.9 F (36.6 C) (Oral)   Resp 18   Ht 6' 4 (1.93 m)   Wt 117.9 kg   SpO2 96%   BMI 31.65 kg/m   Physical Exam Vitals and nursing note reviewed.   Constitutional:      General: He is not in acute distress.    Appearance: Normal appearance.  HENT:     Head: Normocephalic and atraumatic.  Eyes:     General:        Right eye: No discharge.        Left eye: No discharge.  Cardiovascular:     Rate and Rhythm: Normal rate and regular rhythm.     Heart sounds: No murmur heard.    No friction rub. No gallop.  Pulmonary:     Effort: Pulmonary effort is normal.     Breath sounds: Normal breath sounds.  Abdominal:     General: Bowel sounds are normal.     Palpations: Abdomen is soft.  Musculoskeletal:     Comments: Focal tenderness in the left lumbar paraspinous muscle region, and over the left greater trochanter, no significant midline lumbar spinal tenderness, intact strength, range of motion of the left hip although with some pain.  Skin:    General: Skin is warm and dry.     Capillary Refill: Capillary refill takes less than 2 seconds.  Neurological:     Mental Status: He is alert and oriented to person, place, and time.  Psychiatric:        Mood and Affect: Mood normal.        Behavior: Behavior normal.     (  all labs ordered are listed, but only abnormal results are displayed) Labs Reviewed - No data to display  EKG: None  Radiology: No results found.   Procedures   Medications Ordered in the ED  gabapentin  (NEURONTIN ) capsule 300 mg (has no administration in time range)  ketorolac  (TORADOL ) 15 MG/ML injection 15 mg (15 mg Intramuscular Given 09/01/24 2008)  fentaNYL  (SUBLIMAZE ) injection 50 mcg (50 mcg Intramuscular Given 09/01/24 2010)  methocarbamol  (ROBAXIN ) tablet 1,000 mg (1,000 mg Oral Given 09/01/24 2101)  morphine  (PF) 4 MG/ML injection 4 mg (4 mg Intravenous Given 09/01/24 2149)                                    Medical Decision Making Risk Prescription drug management.   Patient with back pain, known history of bulging disc.  My emergent differential diagnosis includes slipped disc, compression  fracture, spondylolisthesis, less clinical concern for epidural abscess or osteomyelitis based on patient history.  Overall with high clinical suspicion for lumbar sprain, sciatica based on clinical presentation, risk factors.  No neurological deficits. Patient is ambulatory with some difficulty. No warning symptoms of back pain including: fecal incontinence, urinary retention or overflow incontinence, night sweats, waking from sleep with back pain, unexplained fevers or weight loss, h/o cancer, IVDU, recent trauma. Overall low clinical concern for cauda equina, epidural abscess, or other serious cause of back pain. Treated with significant pain medication in ER secondary to discomfort.  Feeling slightly more comfortable on reassessment.  Given this work-up, evaluation, physical exam I do not believe that radiographic imaging is indicated at this time.  Conservative measures such as rest, ice/heat, ibuprofen, Tylenol , and  prescription for Robaxin , percocet, and gabapentin  indicated with orthopedic follow-up if no improvement with conservative management.  He already does have follow-up appointment with orthopedic scheduled.  Extensive return precautions given, patient discharged in stable condition at this time.  Final diagnoses:  Sciatica of left side    ED Discharge Orders          Ordered    gabapentin  (NEURONTIN ) 300 MG capsule  3 times daily        09/01/24 2219    oxyCODONE -acetaminophen  (PERCOCET/ROXICET) 5-325 MG tablet  Every 6 hours PRN        09/01/24 2219    methocarbamol  (ROBAXIN ) 500 MG tablet  2 times daily        09/01/24 2219               Rosan Sherlean VEAR DEVONNA 09/01/24 2223    Francesca Elsie CROME, MD 09/01/24 [phone]

## 2024-09-01 NOTE — ED Triage Notes (Addendum)
 Pt arrives to triage with complaints of Low back pain that moves down into the LEFT leg. Pt reports that the pain is significantly worse today than typical. Pt is due to be seen at [facility] in 2 days.   Pt appears uncomfortable during triage.

## 2024-09-03 NOTE — Progress Notes (Signed)
 I, Leotis Batter, CMA acting as a scribe for Artist Lloyd, MD.  Mark Spencer is a 38 y.o. male who presents to [facility] at [facility] today for exacerbation of his lumbar radiculopathy. Pt was last seen by Dr. Lloyd on 07/24/23 and was prescribed prednisone , gabapentin , and l-spine MRI was ordered.  Based on MRI findings, ESI was ordered. Most recent [facility] 11/06/23.  Today, pt reports exacerbation of lumbar radiculoathy x 4 weeks. Pt was seen at the [facility] ED on Monday. Pt locates pain to L LE. Minimal relief with HEP / stretching. Sx worsening since onset. Sx causing night disturbance. Having pain deep in the hip. Taking Gabapentin  and muscle relaxer with short-term relief.   Radiating pain: L LE LE numbness/tingling  L LE LE weakness: L LE Aggravates: side-lying, ambulation Treatments tried: Gabapentin , muscle relaxer, stretching  Dx testing: 07/31/23 L-spine MRI  07/23/24 L-spine XR  Pertinent review of systems: No fevers or chills  Relevant historical information: Lumbar radiculopathy.  History of ileostomy and colostomy.   Exam:  BP (!) 152/98   Pulse 83   Ht 6' 4 (1.93 m)   SpO2 97%   BMI 31.65 kg/m  General: Well Developed, well nourished, and in no acute distress.   MSK: L-spine: Normal-appearing Nontender palpation spinal midline. Decreased lumbar motion. Lower extremity strength is intact. Positive left-sided slump test. Reflexes are intact.    Lab and Radiology Results  EXAM: MRI LUMBAR SPINE WITHOUT CONTRAST   TECHNIQUE: Multiplanar, multisequence MR imaging of the lumbar spine was performed. No intravenous contrast was administered.   COMPARISON:  Lumbar spine radiographs 07/24/2023   FINDINGS: Segmentation:  Standard.   Alignment: Straightening of the normal lumbar lordosis and slight S-shaped curvature of the lumbar spine. No significant listhesis.   Vertebrae: No acute fracture, suspicious marrow lesion,  or significant marrow edema. Minimal chronic anterior wedging of the L1 vertebral body.   Conus medullaris and cauda equina: Conus extends to the L1-2 level. Conus and cauda equina appear normal.   Paraspinal and other soft tissues: Unremarkable.   Disc levels:   Disc desiccation and up to moderate disc space narrowing at L2-3, L4-5, and L5-S1.   T12-L1: Negative.   L1-2: Negative.   L2-3: Disc bulging and mild facet hypertrophy result in minimal to mild left greater than right lateral recess stenosis without spinal or neural foraminal stenosis.   L3-4: Mild disc bulging and mild facet hypertrophy result in minimal to mild left lateral recess stenosis without spinal or neural foraminal stenosis.   L4-5: Disc bulging, a moderately large central to left subarticular disc extrusion with slight inferior migration, and mild to moderate facet hypertrophy result in mild spinal stenosis, mild-to-moderate right and severe left lateral recess stenosis, and minimal to mild bilateral neural foraminal stenosis. Left L5 nerve root impingement in the lateral recess.   L5-S1: Mild disc bulging results in mild right neural foraminal stenosis without spinal stenosis.   IMPRESSION: 1. 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 2. Up to mild lateral recess and neural foraminal stenosis elsewhere as above.     Electronically Signed   By: Dasie Hamburg M.D.   On: 08/07/2023 13:55 I, Artist Lloyd, personally (independently) visualized and performed the interpretation of the images attached in this note.    Assessment and Plan: 38 y.o. male with left lumbar radiculopathy.  This is an acute exacerbation of a chronic problem.  Plan for  course of prednisone  gabapentin  and updated epidural steroid injection.  MRI is now over a year old.  If not improved consider updated MRI and potential surgical planning.   PDMP not reviewed this  encounter. Orders Placed This Encounter  Procedures   DG INJECT DIAG/THERA/INC NEEDLE/CATH/PLC EPI/LUMB/SAC W/IMG    Level and technique per radiology    Standing Status:   Future    Expiration Date:   09/04/2025    Reason for Exam (SYMPTOM  OR DIAGNOSIS REQUIRED):   Low back pain    Preferred Imaging Location?:   [facility]   Meds ordered this encounter  Medications   gabapentin  (NEURONTIN ) 300 MG capsule    Sig: Take 1-2 capsules (300-600 mg total) by mouth 3 (three) times daily.    Dispense:  90 capsule    Refill:  3   predniSONE  (DELTASONE ) 50 MG tablet    Sig: Take 1 tablet (50 mg total) by mouth daily.    Dispense:  5 tablet    Refill:  0     Discussed warning signs or symptoms. Please see discharge instructions. Patient expresses understanding.   The above documentation has been reviewed and is accurate and complete Artist Lloyd, M.D.

## 2024-09-04 ENCOUNTER — Encounter: Payer: Self-pay | Admitting: Family Medicine

## 2024-09-04 ENCOUNTER — Ambulatory Visit: Payer: Self-pay | Admitting: Family Medicine

## 2024-09-04 VITALS — BP 152/98 | HR 83 | Ht 76.0 in

## 2024-09-04 DIAGNOSIS — M5416 Radiculopathy, lumbar region: Secondary | ICD-10-CM

## 2024-09-04 MED ORDER — GABAPENTIN 300 MG PO CAPS: 300.0000 mg | ORAL_CAPSULE | Freq: Three times a day (TID) | ORAL | 3 refills | Status: AC

## 2024-09-04 MED ORDER — PREDNISONE 50 MG PO TABS: 50.0000 mg | ORAL_TABLET | Freq: Every day | ORAL | 0 refills | Status: DC

## 2024-09-04 NOTE — Patient Instructions (Addendum)
 Thank you for coming in today.   Refilled Gabapentin  and prescribed Prednisone .   Order placed for back injection.

## 2024-09-08 ENCOUNTER — Encounter: Payer: Self-pay | Admitting: Family Medicine

## 2024-09-09 NOTE — Telephone Encounter (Signed)
 Forwarding to Dr. Denyse Amass to review and advise.

## 2024-09-10 NOTE — Telephone Encounter (Signed)
 Forwarding to Dr. Denyse Amass to review and advise.

## 2024-09-11 MED ORDER — PREDNISONE 10 MG (48) PO TBPK: ORAL_TABLET | Freq: Every day | ORAL | 0 refills | Status: AC

## 2024-09-11 NOTE — Telephone Encounter (Signed)
 Hey Whitley,  Pt reached out so I wanted to follow up on this.   Thanks!

## 2024-09-23 ENCOUNTER — Other Ambulatory Visit: Payer: Self-pay
